# Patient Record
Sex: Male | Born: 1935 | Race: White | Hispanic: No | Marital: Married | Smoking: Former smoker
Health system: Southern US, Community
[De-identification: ages and names within clinical notes are randomized; demographics above are authoritative.]

## PROBLEM LIST (undated history)

## (undated) DIAGNOSIS — R339 Retention of urine, unspecified: Secondary | ICD-10-CM

## (undated) DIAGNOSIS — I10 Essential (primary) hypertension: Secondary | ICD-10-CM

## (undated) HISTORY — PX: MOUTH SURGERY: SHX715

---

## 2007-09-14 ENCOUNTER — Emergency Department (HOSPITAL_COMMUNITY): Admission: EM | Admit: 2007-09-14 | Discharge: 2007-09-14 | Payer: Self-pay | Admitting: Emergency Medicine

## 2008-08-17 ENCOUNTER — Emergency Department (HOSPITAL_COMMUNITY): Admission: EM | Admit: 2008-08-17 | Discharge: 2008-08-17 | Payer: Self-pay | Admitting: Emergency Medicine

## 2008-08-23 ENCOUNTER — Emergency Department (HOSPITAL_COMMUNITY): Admission: EM | Admit: 2008-08-23 | Discharge: 2008-08-24 | Payer: Self-pay | Admitting: Emergency Medicine

## 2008-12-22 ENCOUNTER — Emergency Department (HOSPITAL_COMMUNITY): Admission: EM | Admit: 2008-12-22 | Discharge: 2008-12-22 | Payer: Self-pay | Admitting: Emergency Medicine

## 2009-06-24 ENCOUNTER — Emergency Department (HOSPITAL_COMMUNITY): Admission: EM | Admit: 2009-06-24 | Discharge: 2009-06-24 | Payer: Self-pay | Admitting: Emergency Medicine

## 2010-04-10 ENCOUNTER — Emergency Department (HOSPITAL_COMMUNITY): Admission: EM | Admit: 2010-04-10 | Discharge: 2010-04-10 | Payer: Self-pay | Admitting: Emergency Medicine

## 2010-04-15 ENCOUNTER — Emergency Department (HOSPITAL_COMMUNITY): Admission: EM | Admit: 2010-04-15 | Discharge: 2010-04-15 | Payer: Self-pay | Admitting: Emergency Medicine

## 2010-06-18 ENCOUNTER — Emergency Department (HOSPITAL_COMMUNITY)
Admission: EM | Admit: 2010-06-18 | Discharge: 2010-06-18 | Payer: Self-pay | Source: Home / Self Care | Admitting: Emergency Medicine

## 2010-07-27 ENCOUNTER — Emergency Department (HOSPITAL_COMMUNITY)
Admission: EM | Admit: 2010-07-27 | Discharge: 2010-07-27 | Payer: Self-pay | Source: Home / Self Care | Admitting: Emergency Medicine

## 2010-07-27 LAB — POCT CARDIAC MARKERS
CKMB, poc: <1 ng/mL — ABNORMAL LOW (ref 1.0–8.0)
CKMB, poc: <1 ng/mL — ABNORMAL LOW (ref 1.0–8.0)
Myoglobin, poc: 63.3 ng/mL (ref 12–200)
Myoglobin, poc: 84.1 ng/mL (ref 12–200)
Troponin i, poc: <0.05 ng/mL (ref 0.00–0.09)
Troponin i, poc: <0.05 ng/mL (ref 0.00–0.09)
Troponin i, poc: <0.05 ng/mL (ref 0.00–0.09)

## 2010-07-27 LAB — BASIC METABOLIC PANEL
BUN: 12 mg/dL (ref 6–23)
CO2: 30 mEq/L (ref 19–32)
Chloride: 99 mEq/L (ref 96–112)
Creatinine, Ser: 0.84 mg/dL (ref 0.4–1.5)
GFR calc Af Amer: >60 mL/min (ref >60)
GFR calc non Af Amer: >60 mL/min (ref >60)
Glucose, Bld: 107 mg/dL — ABNORMAL HIGH (ref 70–99)
Potassium: 3.9 mEq/L (ref 3.5–5.1)
Sodium: 139 mEq/L (ref 135–145)

## 2010-07-27 LAB — CBC
Hemoglobin: 14.2 g/dL (ref 13.0–17.0)
MCH: 31.8 pg (ref 26.0–34.0)
MCHC: 33.7 g/dL (ref 30.0–36.0)
MCV: 94.4 fL (ref 78.0–100.0)
Platelets: 179 10*3/uL (ref 150–400)
RBC: 4.46 MIL/uL (ref 4.22–5.81)
RDW: 11.8 % (ref 11.5–15.5)
WBC: 4.1 10*3/uL (ref 4.0–10.5)

## 2010-07-27 LAB — DIFFERENTIAL
Basophils Absolute: 0 10*3/uL (ref 0.0–0.1)
Eosinophils Absolute: 0.1 10*3/uL (ref 0.0–0.7)
Eosinophils Relative: 3 % (ref 0–5)
Lymphs Abs: 0.5 10*3/uL — ABNORMAL LOW (ref 0.7–4.0)
Monocytes Relative: 14 % — ABNORMAL HIGH (ref 3–12)
Neutro Abs: 2.9 10*3/uL (ref 1.7–7.7)
Neutrophils Relative %: 71 % (ref 43–77)

## 2010-09-12 LAB — COMPREHENSIVE METABOLIC PANEL
ALT: 13 U/L (ref 0–53)
AST: 20 U/L (ref 0–37)
Albumin: 4 g/dL (ref 3.5–5.2)
Alkaline Phosphatase: 92 U/L (ref 39–117)
BUN: 10 mg/dL (ref 6–23)
CO2: 28 mEq/L (ref 19–32)
Calcium: 9 mg/dL (ref 8.4–10.5)
Chloride: 104 mEq/L (ref 96–112)
Creatinine, Ser: 0.83 mg/dL (ref 0.4–1.5)
GFR calc Af Amer: >60 mL/min (ref >60)
GFR calc non Af Amer: >60 mL/min (ref >60)
Glucose, Bld: 116 mg/dL — ABNORMAL HIGH (ref 70–99)
Potassium: 4.5 mEq/L (ref 3.5–5.1)
Sodium: 141 mEq/L (ref 135–145)
Total Bilirubin: 1.1 mg/dL (ref 0.3–1.2)
Total Protein: 6.7 g/dL (ref 6.0–8.3)

## 2010-09-12 LAB — DIFFERENTIAL
Basophils Absolute: 0 10*3/uL (ref 0.0–0.1)
Basophils Relative: 0 % (ref 0–1)
Eosinophils Absolute: 0.1 10*3/uL (ref 0.0–0.7)
Eosinophils Relative: 1 % (ref 0–5)
Lymphocytes Relative: 11 % — ABNORMAL LOW (ref 12–46)
Lymphs Abs: 0.5 10*3/uL — ABNORMAL LOW (ref 0.7–4.0)
Monocytes Absolute: 0.4 10*3/uL (ref 0.1–1.0)
Monocytes Relative: 8 % (ref 3–12)
Neutro Abs: 3.5 10*3/uL (ref 1.7–7.7)
Neutrophils Relative %: 80 % — ABNORMAL HIGH (ref 43–77)

## 2010-09-12 LAB — CBC
HCT: 43.2 % (ref 39.0–52.0)
Hemoglobin: 15.1 g/dL (ref 13.0–17.0)
MCH: 33.6 pg (ref 26.0–34.0)
MCHC: 35 g/dL (ref 30.0–36.0)
MCV: 96 fL (ref 78.0–100.0)
Platelets: 191 10*3/uL (ref 150–400)
RBC: 4.5 MIL/uL (ref 4.22–5.81)
RDW: 12 % (ref 11.5–15.5)
WBC: 4.4 10*3/uL (ref 4.0–10.5)

## 2010-09-12 LAB — URINALYSIS, ROUTINE W REFLEX MICROSCOPIC
Bilirubin Urine: NEGATIVE
Glucose, UA: NEGATIVE mg/dL
Hgb urine dipstick: NEGATIVE
Ketones, ur: NEGATIVE mg/dL
Nitrite: NEGATIVE
Protein, ur: NEGATIVE mg/dL
Specific Gravity, Urine: 1.011 (ref 1.005–1.030)
Urobilinogen, UA: 0.2 mg/dL (ref 0.0–1.0)
pH: 7.5 (ref 5.0–8.0)

## 2010-09-15 LAB — DIFFERENTIAL
Basophils Absolute: 0 10*3/uL (ref 0.0–0.1)
Basophils Relative: 0 % (ref 0–1)
Eosinophils Absolute: 0 10*3/uL (ref 0.0–0.7)
Eosinophils Relative: 0 % (ref 0–5)
Lymphocytes Relative: 5 % — ABNORMAL LOW (ref 12–46)
Lymphs Abs: 0.4 10*3/uL — ABNORMAL LOW (ref 0.7–4.0)
Monocytes Absolute: 0.3 10*3/uL (ref 0.1–1.0)
Monocytes Relative: 4 % (ref 3–12)
Neutro Abs: 6.5 10*3/uL (ref 1.7–7.7)
Neutrophils Relative %: 91 % — ABNORMAL HIGH (ref 43–77)

## 2010-09-15 LAB — COMPREHENSIVE METABOLIC PANEL
ALT: 15 U/L (ref 0–53)
AST: 19 U/L (ref 0–37)
Albumin: 4.3 g/dL (ref 3.5–5.2)
Alkaline Phosphatase: 75 U/L (ref 39–117)
BUN: 12 mg/dL (ref 6–23)
CO2: 27 mEq/L (ref 19–32)
Calcium: 9.4 mg/dL (ref 8.4–10.5)
Chloride: 104 mEq/L (ref 96–112)
Creatinine, Ser: 0.97 mg/dL (ref 0.4–1.5)
GFR calc Af Amer: >60 mL/min (ref >60)
GFR calc non Af Amer: >60 mL/min (ref >60)
Glucose, Bld: 125 mg/dL — ABNORMAL HIGH (ref 70–99)
Potassium: 4.1 mEq/L (ref 3.5–5.1)
Sodium: 139 mEq/L (ref 135–145)
Total Bilirubin: 1.6 mg/dL — ABNORMAL HIGH (ref 0.3–1.2)
Total Protein: 7 g/dL (ref 6.0–8.3)

## 2010-09-15 LAB — CBC
HCT: 45.1 % (ref 39.0–52.0)
Hemoglobin: 15.6 g/dL (ref 13.0–17.0)
MCH: 33.1 pg (ref 26.0–34.0)
MCHC: 34.7 g/dL (ref 30.0–36.0)
MCV: 95.4 fL (ref 78.0–100.0)
Platelets: 220 10*3/uL (ref 150–400)
RBC: 4.73 MIL/uL (ref 4.22–5.81)
RDW: 12.8 % (ref 11.5–15.5)
WBC: 7.2 10*3/uL (ref 4.0–10.5)

## 2010-10-03 LAB — CBC
MCV: 95.7 fL (ref 78.0–100.0)
Platelets: 199 10*3/uL (ref 150–400)
RBC: 4.15 MIL/uL — ABNORMAL LOW (ref 4.22–5.81)
WBC: 10.9 10*3/uL — ABNORMAL HIGH (ref 4.0–10.5)

## 2010-10-03 LAB — COMPREHENSIVE METABOLIC PANEL
ALT: 23 U/L (ref 0–53)
AST: 22 U/L (ref 0–37)
Albumin: 3.9 g/dL (ref 3.5–5.2)
CO2: 24 mEq/L (ref 19–32)
Chloride: 104 mEq/L (ref 96–112)
Creatinine, Ser: 1.31 mg/dL (ref 0.4–1.5)
GFR calc Af Amer: >60 mL/min (ref >60)
GFR calc non Af Amer: 53 mL/min — ABNORMAL LOW (ref >60)
Potassium: 4.2 mEq/L (ref 3.5–5.1)
Sodium: 136 mEq/L (ref 135–145)
Total Bilirubin: 1.3 mg/dL — ABNORMAL HIGH (ref 0.3–1.2)

## 2010-10-03 LAB — RAPID URINE DRUG SCREEN, HOSP PERFORMED
Amphetamines: NOT DETECTED
Barbiturates: NOT DETECTED
Benzodiazepines: NOT DETECTED
Cocaine: NOT DETECTED
Opiates: NOT DETECTED
Tetrahydrocannabinol: NOT DETECTED

## 2010-10-03 LAB — URINALYSIS, ROUTINE W REFLEX MICROSCOPIC
Bilirubin Urine: NEGATIVE
Glucose, UA: NEGATIVE mg/dL
Hgb urine dipstick: NEGATIVE
Nitrite: NEGATIVE
Protein, ur: NEGATIVE mg/dL
Specific Gravity, Urine: 1.018 (ref 1.005–1.030)
Urobilinogen, UA: 1 mg/dL (ref 0.0–1.0)
pH: 6 (ref 5.0–8.0)

## 2010-10-03 LAB — DIFFERENTIAL
Basophils Absolute: 0 10*3/uL (ref 0.0–0.1)
Eosinophils Absolute: 0 10*3/uL (ref 0.0–0.7)
Eosinophils Relative: 0 % (ref 0–5)
Lymphocytes Relative: 3 % — ABNORMAL LOW (ref 12–46)
Monocytes Absolute: 0.4 10*3/uL (ref 0.1–1.0)

## 2010-10-03 LAB — HEPATIC FUNCTION PANEL
ALT: 20 U/L (ref 0–53)
Alkaline Phosphatase: 73 U/L (ref 39–117)
Bilirubin, Direct: 0.1 mg/dL (ref 0.0–0.3)
Indirect Bilirubin: 1.4 mg/dL — ABNORMAL HIGH (ref 0.3–0.9)

## 2010-10-03 LAB — ETHANOL: Alcohol, Ethyl (B): <5 mg/dL (ref 0–10)

## 2010-10-10 LAB — URINALYSIS, ROUTINE W REFLEX MICROSCOPIC
Nitrite: NEGATIVE
Specific Gravity, Urine: 1.025 (ref 1.005–1.030)
Urobilinogen, UA: 1 mg/dL (ref 0.0–1.0)

## 2010-10-10 LAB — BASIC METABOLIC PANEL
CO2: 28 mEq/L (ref 19–32)
Chloride: 102 mEq/L (ref 96–112)
GFR calc Af Amer: >60 mL/min (ref >60)
Glucose, Bld: 127 mg/dL — ABNORMAL HIGH (ref 70–99)
Potassium: 4.1 mEq/L (ref 3.5–5.1)
Sodium: 139 mEq/L (ref 135–145)

## 2010-10-18 LAB — POCT CARDIAC MARKERS
CKMB, poc: 1.1 ng/mL (ref 1.0–8.0)
CKMB, poc: <1 ng/mL — ABNORMAL LOW (ref 1.0–8.0)
Myoglobin, poc: 49.7 ng/mL (ref 12–200)
Troponin i, poc: <0.05 ng/mL (ref 0.00–0.09)
Troponin i, poc: <0.05 ng/mL (ref 0.00–0.09)
Troponin i, poc: <0.05 ng/mL (ref 0.00–0.09)

## 2010-10-18 LAB — DIFFERENTIAL
Eosinophils Absolute: 0 10*3/uL (ref 0.0–0.7)
Eosinophils Relative: 0 % (ref 0–5)
Lymphs Abs: 0.2 10*3/uL — ABNORMAL LOW (ref 0.7–4.0)

## 2010-10-18 LAB — COMPREHENSIVE METABOLIC PANEL
ALT: 16 U/L (ref 0–53)
AST: 22 U/L (ref 0–37)
Alkaline Phosphatase: 90 U/L (ref 39–117)
CO2: 27 mEq/L (ref 19–32)
Calcium: 8.9 mg/dL (ref 8.4–10.5)
Chloride: 102 mEq/L (ref 96–112)
GFR calc Af Amer: >60 mL/min (ref >60)
GFR calc non Af Amer: >60 mL/min (ref >60)
Potassium: 4.1 mEq/L (ref 3.5–5.1)
Sodium: 141 mEq/L (ref 135–145)

## 2010-10-18 LAB — CBC
MCHC: 34.5 g/dL (ref 30.0–36.0)
RBC: 5.01 MIL/uL (ref 4.22–5.81)
WBC: 14.2 10*3/uL — ABNORMAL HIGH (ref 4.0–10.5)

## 2010-10-18 LAB — LIPASE, BLOOD: Lipase: 35 U/L (ref 11–59)

## 2010-11-10 ENCOUNTER — Emergency Department (HOSPITAL_COMMUNITY): Payer: Medicare Other

## 2010-11-10 ENCOUNTER — Emergency Department (HOSPITAL_COMMUNITY)
Admission: EM | Admit: 2010-11-10 | Discharge: 2010-11-10 | Disposition: A | Discharge status: Home / Self Care | Payer: Medicare Other | Attending: Emergency Medicine | Admitting: Emergency Medicine

## 2010-11-10 DIAGNOSIS — R109 Unspecified abdominal pain: Secondary | ICD-10-CM | POA: Insufficient documentation

## 2010-11-10 DIAGNOSIS — I10 Essential (primary) hypertension: Secondary | ICD-10-CM | POA: Insufficient documentation

## 2010-11-10 DIAGNOSIS — F329 Major depressive disorder, single episode, unspecified: Secondary | ICD-10-CM | POA: Insufficient documentation

## 2010-11-10 DIAGNOSIS — F3289 Other specified depressive episodes: Secondary | ICD-10-CM | POA: Insufficient documentation

## 2010-11-10 DIAGNOSIS — K5900 Constipation, unspecified: Secondary | ICD-10-CM | POA: Insufficient documentation

## 2010-11-10 DIAGNOSIS — E7800 Pure hypercholesterolemia, unspecified: Secondary | ICD-10-CM | POA: Insufficient documentation

## 2010-12-16 ENCOUNTER — Emergency Department (HOSPITAL_COMMUNITY)
Admission: EM | Admit: 2010-12-16 | Discharge: 2010-12-16 | Disposition: A | Discharge status: Home / Self Care | Payer: Medicare Other | Attending: Emergency Medicine | Admitting: Emergency Medicine

## 2010-12-16 ENCOUNTER — Emergency Department (HOSPITAL_COMMUNITY): Payer: Medicare Other

## 2010-12-16 DIAGNOSIS — K5900 Constipation, unspecified: Secondary | ICD-10-CM | POA: Insufficient documentation

## 2010-12-16 DIAGNOSIS — I10 Essential (primary) hypertension: Secondary | ICD-10-CM | POA: Insufficient documentation

## 2011-01-27 ENCOUNTER — Emergency Department (HOSPITAL_COMMUNITY)
Admission: EM | Admit: 2011-01-27 | Discharge: 2011-01-27 | Disposition: A | Discharge status: Home / Self Care | Payer: Medicare Other | Attending: Emergency Medicine | Admitting: Emergency Medicine

## 2011-01-27 DIAGNOSIS — K5900 Constipation, unspecified: Secondary | ICD-10-CM | POA: Insufficient documentation

## 2011-01-27 DIAGNOSIS — I10 Essential (primary) hypertension: Secondary | ICD-10-CM | POA: Insufficient documentation

## 2011-01-27 DIAGNOSIS — F329 Major depressive disorder, single episode, unspecified: Secondary | ICD-10-CM | POA: Insufficient documentation

## 2011-01-27 DIAGNOSIS — F3289 Other specified depressive episodes: Secondary | ICD-10-CM | POA: Insufficient documentation

## 2011-01-27 DIAGNOSIS — Z79899 Other long term (current) drug therapy: Secondary | ICD-10-CM | POA: Insufficient documentation

## 2011-03-20 ENCOUNTER — Inpatient Hospital Stay (HOSPITAL_COMMUNITY)
Admission: EM | Admit: 2011-03-20 | Discharge: 2011-03-22 | DRG: 918 | Disposition: A | Discharge status: Home Health | Payer: Medicare Other | Attending: Family Medicine | Admitting: Family Medicine

## 2011-03-20 ENCOUNTER — Emergency Department (HOSPITAL_COMMUNITY): Payer: Medicare Other

## 2011-03-20 DIAGNOSIS — F329 Major depressive disorder, single episode, unspecified: Secondary | ICD-10-CM | POA: Diagnosis present

## 2011-03-20 DIAGNOSIS — Z66 Do not resuscitate: Secondary | ICD-10-CM | POA: Diagnosis present

## 2011-03-20 DIAGNOSIS — R4182 Altered mental status, unspecified: Secondary | ICD-10-CM | POA: Diagnosis present

## 2011-03-20 DIAGNOSIS — F3289 Other specified depressive episodes: Secondary | ICD-10-CM | POA: Diagnosis present

## 2011-03-20 DIAGNOSIS — T424X1A Poisoning by benzodiazepines, accidental (unintentional), initial encounter: Secondary | ICD-10-CM | POA: Diagnosis present

## 2011-03-20 DIAGNOSIS — T424X4A Poisoning by benzodiazepines, undetermined, initial encounter: Principal | ICD-10-CM | POA: Diagnosis present

## 2011-03-20 DIAGNOSIS — E860 Dehydration: Secondary | ICD-10-CM | POA: Diagnosis present

## 2011-03-20 DIAGNOSIS — Y92009 Unspecified place in unspecified non-institutional (private) residence as the place of occurrence of the external cause: Secondary | ICD-10-CM

## 2011-03-20 DIAGNOSIS — F411 Generalized anxiety disorder: Secondary | ICD-10-CM | POA: Diagnosis present

## 2011-03-20 DIAGNOSIS — I10 Essential (primary) hypertension: Secondary | ICD-10-CM | POA: Diagnosis present

## 2011-03-20 LAB — URINALYSIS, ROUTINE W REFLEX MICROSCOPIC
Glucose, UA: NEGATIVE mg/dL
Ketones, ur: NEGATIVE mg/dL
Leukocytes, UA: NEGATIVE
Nitrite: NEGATIVE
Protein, ur: NEGATIVE mg/dL

## 2011-03-20 LAB — CBC
Platelets: 186 10*3/uL (ref 150–400)
RDW: 11.5 % (ref 11.5–15.5)
WBC: 4.9 10*3/uL (ref 4.0–10.5)

## 2011-03-20 LAB — BLOOD GAS, ARTERIAL
Acid-Base Excess: 4.3 mmol/L — ABNORMAL HIGH (ref 0.0–2.0)
Bicarbonate: 28.8 mEq/L — ABNORMAL HIGH (ref 20.0–24.0)
FIO2: 0.21 %
O2 Saturation: 96.2 %
Patient temperature: 37
TCO2: 25.6 mmol/L (ref 0–100)
pO2, Arterial: 77.4 mmHg — ABNORMAL LOW (ref 80.0–100.0)

## 2011-03-20 LAB — SALICYLATE LEVEL: Salicylate Lvl: <2 mg/dL — ABNORMAL LOW (ref 2.8–20.0)

## 2011-03-20 LAB — ETHANOL: Alcohol, Ethyl (B): <11 mg/dL (ref 0–11)

## 2011-03-20 LAB — DIFFERENTIAL
Basophils Absolute: 0 10*3/uL (ref 0.0–0.1)
Eosinophils Absolute: 0.1 10*3/uL (ref 0.0–0.7)
Eosinophils Relative: 1 % (ref 0–5)
Lymphocytes Relative: 12 % (ref 12–46)

## 2011-03-20 LAB — COMPREHENSIVE METABOLIC PANEL
Albumin: 3 g/dL — ABNORMAL LOW (ref 3.5–5.2)
BUN: 14 mg/dL (ref 6–23)
Creatinine, Ser: 0.78 mg/dL (ref 0.50–1.35)
Total Bilirubin: 0.3 mg/dL (ref 0.3–1.2)
Total Protein: 5.5 g/dL — ABNORMAL LOW (ref 6.0–8.3)

## 2011-03-20 LAB — RAPID URINE DRUG SCREEN, HOSP PERFORMED
Amphetamines: NOT DETECTED
Barbiturates: NOT DETECTED

## 2011-03-20 LAB — APTT: aPTT: 30 seconds (ref 24–37)

## 2011-03-20 LAB — GLUCOSE, CAPILLARY: Glucose-Capillary: 130 mg/dL — ABNORMAL HIGH (ref 70–99)

## 2011-03-20 LAB — PROTIME-INR: INR: 1.04 (ref 0.00–1.49)

## 2011-03-21 ENCOUNTER — Emergency Department (HOSPITAL_COMMUNITY): Payer: Medicare Other

## 2011-03-21 LAB — BASIC METABOLIC PANEL
BUN: 11 mg/dL (ref 6–23)
CO2: 27 mEq/L (ref 19–32)
Chloride: 103 mEq/L (ref 96–112)
Creatinine, Ser: 0.67 mg/dL (ref 0.50–1.35)
GFR calc Af Amer: >60 mL/min (ref >60)
Glucose, Bld: 103 mg/dL — ABNORMAL HIGH (ref 70–99)
Potassium: 4.3 mEq/L (ref 3.5–5.1)

## 2011-03-21 LAB — URINE CULTURE
Colony Count: NO GROWTH
Culture  Setup Time: 201209180227

## 2011-03-22 ENCOUNTER — Emergency Department (HOSPITAL_COMMUNITY)
Admission: EM | Admit: 2011-03-22 | Discharge: 2011-03-23 | Disposition: A | Discharge status: Home / Self Care | Payer: Medicare Other | Attending: Emergency Medicine | Admitting: Emergency Medicine

## 2011-03-22 DIAGNOSIS — F29 Unspecified psychosis not due to a substance or known physiological condition: Secondary | ICD-10-CM | POA: Insufficient documentation

## 2011-03-22 DIAGNOSIS — R4182 Altered mental status, unspecified: Secondary | ICD-10-CM | POA: Insufficient documentation

## 2011-03-22 DIAGNOSIS — F3289 Other specified depressive episodes: Secondary | ICD-10-CM | POA: Insufficient documentation

## 2011-03-22 DIAGNOSIS — IMO0002 Reserved for concepts with insufficient information to code with codable children: Secondary | ICD-10-CM | POA: Insufficient documentation

## 2011-03-22 DIAGNOSIS — F329 Major depressive disorder, single episode, unspecified: Secondary | ICD-10-CM | POA: Insufficient documentation

## 2011-03-22 DIAGNOSIS — I10 Essential (primary) hypertension: Secondary | ICD-10-CM | POA: Insufficient documentation

## 2011-03-22 DIAGNOSIS — Z79899 Other long term (current) drug therapy: Secondary | ICD-10-CM | POA: Insufficient documentation

## 2011-03-22 LAB — CBC
HCT: 37.2 % — ABNORMAL LOW (ref 39.0–52.0)
MCHC: 34.1 g/dL (ref 30.0–36.0)
MCHC: 35.6 g/dL (ref 30.0–36.0)
Platelets: 188 10*3/uL (ref 150–400)
RDW: 11.6 % (ref 11.5–15.5)
RDW: 11.5 % (ref 11.5–15.5)
WBC: 6 10*3/uL (ref 4.0–10.5)
WBC: 5.1 10*3/uL (ref 4.0–10.5)

## 2011-03-22 LAB — URINALYSIS, ROUTINE W REFLEX MICROSCOPIC
Bilirubin Urine: NEGATIVE
Glucose, UA: NEGATIVE mg/dL
Ketones, ur: NEGATIVE mg/dL
pH: 6.5 (ref 5.0–8.0)

## 2011-03-22 LAB — URINE MICROSCOPIC-ADD ON

## 2011-03-22 LAB — DIFFERENTIAL
Basophils Absolute: 0 10*3/uL (ref 0.0–0.1)
Basophils Relative: 0 % (ref 0–1)
Eosinophils Relative: 1 % (ref 0–5)
Monocytes Absolute: 0.5 10*3/uL (ref 0.1–1.0)
Neutro Abs: 3.9 10*3/uL (ref 1.7–7.7)

## 2011-03-22 LAB — COMPREHENSIVE METABOLIC PANEL
ALT: 10 U/L (ref 0–53)
AST: 17 U/L (ref 0–37)
Albumin: 3.4 g/dL — ABNORMAL LOW (ref 3.5–5.2)
Alkaline Phosphatase: 88 U/L (ref 39–117)
Chloride: 100 mEq/L (ref 96–112)
Potassium: 3.4 mEq/L — ABNORMAL LOW (ref 3.5–5.1)
Sodium: 138 mEq/L (ref 135–145)
Total Protein: 6.2 g/dL (ref 6.0–8.3)

## 2011-03-22 LAB — BASIC METABOLIC PANEL
BUN: 11 mg/dL (ref 6–23)
Chloride: 103 mEq/L (ref 96–112)
Creatinine, Ser: 0.67 mg/dL (ref 0.50–1.35)
GFR calc Af Amer: >60 mL/min (ref >60)
GFR calc non Af Amer: >60 mL/min (ref >60)
Potassium: 3.7 mEq/L (ref 3.5–5.1)

## 2011-03-22 LAB — TSH: TSH: 0.547 u[IU]/mL (ref 0.350–4.500)

## 2011-03-24 NOTE — Discharge Summary (Signed)
NAME:  KENTARIUS, PARTINGTON NO.:  [id number]  MEDICAL RECORD NO.:  [id number]  LOCATION:  1406                         FACILITY:  [facility]  PHYSICIAN:  Brendia Sacks, MD    DATE OF BIRTH:  1933/06/07  DATE OF ADMISSION:  03/20/2011 DATE OF DISCHARGE:  03/22/2011                              DISCHARGE SUMMARY   PRIMARY CARE PHYSICIAN:  Dr. Tiburcio Pea at [facility].  CONDITION ON DISCHARGE:  Improved.  DISPOSITION:  Home with home health physical therapy.  HISTORY OF PRESENT ILLNESS:  This is a 77 year old man, who was found sitting on the floor at home with confusion and mumbling and being disoriented.  He was brought to the emergency room for further evaluation and treatment.  HOSPITAL COURSE:  Mr. Goncalves was admitted to the medical floor.  He was noted to be alert and oriented on admission per the admitting physician's history and physical.  I have discussed the patient's history with his wife present today, who reports the patient was in his usual state of health when he was given his Seroquel for the evening with [place] sandwich and some Colace for the constipation.  Somewhat later, he was found sitting on the floor, mumbling and disoriented. There was no seizure activity.  There is no focal weakness.  Of note, the patient does have a longstanding history of urinary incontinence. The patient is on Seroquel and Effexor for depression, which is longstanding, has been ongoing for approximately 5 years.  His normal day is a fairly sedentary one.  He spends most of his time watching news programs on television, does minimal walking, drinks minimal fluids and is hyper focused on bowel movements.  He was recently restarted on Seroquel approximately 1 month or more so ago and has been tolerating this medication well.  His wife administers all his medications with the exception of lorazepam.  She has allowed him to administer this medication because she  works and he sometimes needs this for anxiety. She reports that his anxiety issues stem from fear of constipation.  The patient has heard in the past that going too long without a bowel movement would be fatal.  The wife does report the patient was concerned about not having had a bowel movement yesterday and took some Colace.  Since admission to the hospital, he has been treated with IV fluids and supportive care, and he has resolved back to baseline.  In further discussion with his wife, it appears to most likely etiology of his constellation of symptoms was secondary to Ativan.  The patient cannot tell me how many Ativan he took that evening, but he may have taken a few.  It is interesting to note that his urine drug screen was negative.  However, this would not be inconsistent with recent ingestion of benzodiazepine.  The patient does confirm that he did take benzodiazepines that evening.  He was noted to have a normal neurologic exam on admission.  Of note, the patient had a normal examination as noted by the emergency room physician as well. His head imaging here is negative.  There are no signs or symptoms to suggest stroke or seizure at this point.  After a long discussion  with the wife, my clinical impression is misuse of his Ativan medication.  I have counseled his wife and suggest that she take control of this medication.  I did discuss further evaluation including echocardiogram and carotid ultrasound as well as other testing; however, she does not feel this is necessary at this point and given the patient's most likely etiology for his acute altered mental status, I think this is quite reasonable.  CONSULTATIONS:  None.  PROCEDURES:  None.  IMAGING: 1. CT of the head, March 20, 2011:  Atrophy and small-vessel     disease.  No acute intracranial findings. 2. MRI of the brain, March 21, 2011:  Atrophy and small-vessel     disease.  No acute intracranial  findings.  MICROBIOLOGY:  Urine culture on March 20, 2011, no growth, final.  PERTINENT LABORATORY STUDIES:  CBC unremarkable with hemoglobin of 12.72.  Basic metabolic panel, unremarkable.  Cardiac function panel, unremarkable.  Urine drug screen was negative.  Serum acetaminophen and salicylate levels were negative.  Alcohol level was negative and urinalysis was negative.  PHYSICAL EXAMINATION:  GENERAL:  On discharge, the patient is feeling well.  No acute issues. VITAL SIGNS:  He is afebrile.  Temperature is 98.6, pulse 80, respirations 20, blood pressure 150/87, saturations 96% on room air. CARDIOVASCULAR:  Regular rate and rhythm.  No murmur, rub, or gallop. RESPIRATORY:  Clear to auscultation bilaterally.  No wheezes, rales, or rhonchi.  Normal respiratory effort.  No lower extremity edema. NEUROLOGIC:  Cranial nerves II-XII are intact.  Speech is fluent and clear tone and strength in the upper and lower extremities is 5/5 and symmetric.  There is no dysstatic kinesis of the upper extremities.  No focal deficits are noted. PSYCHIATRIC:  Flat affect, depressed mood.  Additionally, the patient's fluid intake is extremely poor.  He does not drink water and is constantly dehydrated.  His wife feels that the patient looks the best he has looked in years.  She suspects dehydration is playing a role as well.  DISCHARGE MEDICATIONS: 1. MiraLax 17 g p.o. daily as needed for constipation. 2. Lorazepam 0.5 mg p.o. b.i.d. as needed for anxiety.  Suggest his     wife take control of his medication. 3. Finasteride 5 mg p.o. daily. 4. Losartan 100 mg p.o. daily. 5. Multivitamin p.o. daily. 6. Quetiapine 300 mg p.o. nightly. 7. Venlafaxine 75 mg p.o. daily. 8. Vitamin E p.o. daily.  There is no suggestion the patient was intending himself any harm.  Time coordinating discharge is 45 minutes.     Brendia Sacks, MD     DG/MEDQ  D:  03/22/2011  T:  03/22/2011  Job:   [id number]  cc:   [facility]  Electronically Signed by Brendia Sacks  on 03/24/2011 09:02:57 PM

## 2011-03-27 LAB — ETHANOL: Alcohol, Ethyl (B): <5

## 2011-03-27 LAB — RAPID URINE DRUG SCREEN, HOSP PERFORMED
Opiates: NOT DETECTED
Tetrahydrocannabinol: NOT DETECTED

## 2011-03-27 LAB — URINALYSIS, ROUTINE W REFLEX MICROSCOPIC
Bilirubin Urine: NEGATIVE
Nitrite: NEGATIVE
Specific Gravity, Urine: 1.014
Urobilinogen, UA: 1

## 2011-03-27 LAB — CBC
HCT: 48
Hemoglobin: 16.7
WBC: 9.2

## 2011-03-27 LAB — DIFFERENTIAL
Eosinophils Relative: 0
Lymphocytes Relative: 5 — ABNORMAL LOW
Lymphs Abs: 0.5 — ABNORMAL LOW
Monocytes Absolute: 0.5

## 2011-03-27 LAB — BASIC METABOLIC PANEL
GFR calc non Af Amer: >60
Glucose, Bld: 123 — ABNORMAL HIGH
Potassium: 4.6
Sodium: 136

## 2011-03-27 NOTE — H&P (Signed)
NAME:  Juan Diaz, Juan Diaz NO.:  [id number]  MEDICAL RECORD NO.:  [id number]  LOCATION:  WLED                         FACILITY:  [facility]  PHYSICIAN:  Candelaria Celeste, DO      DATE OF BIRTH:  06/07/1933  DATE OF ADMISSION:  03/20/2011 DATE OF DISCHARGE:                             HISTORY & PHYSICAL   HISTORY OF PRESENT ILLNESS:  Mr. Manlove is a 77 year old male who has an acute onset of altered mental status since this evening.  The patient's wife gave the history who states that approximately 30 minutes after dinner and evening medications of Seroquel and Colace, the patient was found sitting on the floor.  He was mumbling and disoriented and appeared to be very weak and unable to stand without assistance.  EMS was immediately called and the patient was brought to the emergency department.  The patient's medications are largely under the care of patient's wife, especially the patient's Seroquel which he takes every evening.  The only medication patient has an access to is his Ativan which is 0.5 mg.  The patient denies overdose and denies fevers, chills, nausea, vomiting, blurred vision, confusion, headache, vision changes. The patient states that he does recall coming to the hospital via ambulance.  PAST MEDICAL HISTORY: 1. Hypertension. 2. Depression. 3. Benign prostatic hypertrophy.  MEDICATIONS: 1. Quetiapine 300 mg q.h.s. 2. Ativan 0.5 mg t.i.d. 3. Losartan 100 mg daily. 4. Venlafaxine 75 mg daily. 5. Finasteride 5 mg daily. 6. Colace.  ALLERGIES:  No known drug allergies.  SURGICAL HISTORY:  No surgeries.  FAMILY HISTORY:  Family history is significant for coronary artery disease, hypertension, colon cancer.  SOCIAL HISTORY:  The patient works at [employer] for 34 years.  He is a smoker who quit 39 years ago.  Denies alcohol or illicit drug use.  REVIEW OF SYSTEMS:  A full 10-system review of systems was performed which is stated in the HPI or  otherwise negative.  PHYSICAL EXAMINATION:  VITAL SIGNS:  Temperature is 97.9, heart rate is 97-100, blood pressure is 93/63, respiratory rate 20, oxygen saturation 97% on room air. GENERAL:  This is an elderly Caucasian male who is in her stated age, who is awake, alert, and oriented x3.  He does mumble but his speech is coherent and fairly intelligible. HEENT: Head normocephalic, atraumatic.  Pupils equal, round, reactive to light.  Extraocular muscles are intact.  Sclerae anicteric and noninjected.  Tympanic membranes are clear.  Mucosal membranes are dry with no erythema present in the posterior oropharynx. NECK:  Supple without lymphadenopathy. LUNGS:  Clear to auscultation bilaterally with no wheezes, rales, or rhonchi. CARDIAC:  Regular rate and rhythm with normal S1 and S2 sounds with no murmurs auscultated. ABDOMEN:  Soft, nontender, nondistended with appropriate bowel sounds. No hepatosplenomegaly or masses palpated. VASCULAR:  2+ dorsalis pedis radial pulses with warm skin and no edema present. MUSCULOSKELETAL:  All major joints are nonerythematous and nonenlarged and nonedematous. NEURO:  Cranial nerves II-XII are grossly intact.  Strength is 5/5 in upper and lower extremities.  No focal deficits observed.  Rapid alternating movements are intact.  There is no ataxia or tremor present. PSYCH:  The patient is fairly expressive with  intelligible speech although mumbled at times.  The content of the speech is normal and pertains to conversation at hand.  LABORATORY DATA: 1. A CMP was drawn which was normal. 2. LFTs:  Normal. 3. CBC showed a white count of 4.9 with all other values normal. 4. Anticoagulants:  INR was 1.04 and PTT was 30. 5. Drug screens:  Tylenol  was negative.  Alcohol was negative.     Salicylates was negative.  A urine drug screen was negative. 6. Urinalysis was negative for leukocytes or nitrites.  IMAGING:  A CT of the brain showed no focal  process.  ASSESSMENT/PLAN: 1. Altered mental status, question etiology - resolving.  The most     common forms of overdose has been ruled out particularly the     patient's lorazepam and the patient's urine drug screen showed     normal levels of benzodiazepine, therefore this appears to not be a     factor of overdose either.  Furthermore, there is no evidence of     infection or electrolyte abnormalities causing altered mental     status.  Remaining in the differential diagnosis would be ischemic     stroke or thyroid abnormalities or psychosis.  The latter being     unlikely due to the rapid resolution.  We will obtain a TSH as well     as an MRI of the head to rule out thyroid abnormalities and     ischemic stroke.  We will admit the patient to telemetry and     continue to monitor the patient. 2. Dehydration, evidenced by clinical exam.  We will continue the     patient's IV fluids and encourage p.o. hydration. 3. Depression.  We will continue the patient's home meds. 4. Hypertension.  Continue the patient's home meds. 5. Anxiety.  Continue the patient's home meds. 6. Code status.  The patient wishes a do not resuscitate which I     believe is accurate in the patient's wishes as it appears patient's     mental status has returned to baseline.  The patient does     demonstrate capacity. 7. DVT prophylaxis.  We will start the patient on Arixtra.          ______________________________ Candelaria Celeste, DO     JS/MEDQ  D:  03/21/2011  T:  03/21/2011  Job:  [id number]  Electronically Signed by Candelaria Celeste DO on 03/27/2011 01:36:02 PM

## 2011-05-21 ENCOUNTER — Emergency Department (HOSPITAL_COMMUNITY)
Admission: EM | Admit: 2011-05-21 | Discharge: 2011-05-21 | Disposition: A | Discharge status: Home / Self Care | Payer: Medicare Other | Attending: Emergency Medicine | Admitting: Emergency Medicine

## 2011-05-21 DIAGNOSIS — R319 Hematuria, unspecified: Secondary | ICD-10-CM | POA: Insufficient documentation

## 2011-05-21 DIAGNOSIS — N390 Urinary tract infection, site not specified: Secondary | ICD-10-CM | POA: Insufficient documentation

## 2011-05-21 DIAGNOSIS — I10 Essential (primary) hypertension: Secondary | ICD-10-CM | POA: Insufficient documentation

## 2011-05-21 DIAGNOSIS — R109 Unspecified abdominal pain: Secondary | ICD-10-CM | POA: Insufficient documentation

## 2011-05-21 DIAGNOSIS — R339 Retention of urine, unspecified: Secondary | ICD-10-CM | POA: Insufficient documentation

## 2011-05-21 HISTORY — DX: Essential (primary) hypertension: I10

## 2011-05-21 HISTORY — DX: Retention of urine, unspecified: R33.9

## 2011-05-21 LAB — URINALYSIS, ROUTINE W REFLEX MICROSCOPIC
Bilirubin Urine: NEGATIVE
Glucose, UA: NEGATIVE mg/dL
Hgb urine dipstick: NEGATIVE
Specific Gravity, Urine: 1.013 (ref 1.005–1.030)
Urobilinogen, UA: 0.2 mg/dL (ref 0.0–1.0)
pH: 7.5 (ref 5.0–8.0)

## 2011-05-21 LAB — URINE MICROSCOPIC-ADD ON

## 2011-05-21 MED ORDER — CIPROFLOXACIN HCL 500 MG PO TABS: 500.0000 mg | ORAL_TABLET | Freq: Two times a day (BID) | ORAL | Status: AC

## 2011-05-21 MED ORDER — CIPROFLOXACIN HCL 500 MG PO TABS
500.0000 mg | ORAL_TABLET | Freq: Once | ORAL | Status: AC
  Administered 2011-05-21: 500 mg via ORAL
  Filled 2011-05-21: qty 1

## 2011-05-21 NOTE — ED Provider Notes (Cosign Needed)
History     CSN: [id number] Arrival date & time: 05/21/2011  4:19 PM   First MD Initiated Contact with Patient 05/21/11 1652      Chief Complaint  Patient presents with  . Urinary Retention    pt in from home with urinary problems states noted blood in urine last night states "small stream" around noon denies pain with urination pt is in no current distress states was told by urgent care to come in    (Consider location/radiation/quality/duration/timing/severity/associated sxs/prior treatment) Patient is a 77 y.o. male presenting with hematuria. The history is provided by the patient (The patient complains of blood in his urine this started today).  Hematuria This is a new problem. The current episode started today. The problem is unchanged. He describes the hematuria as gross hematuria. The hematuria occurs during the initial portion of his urinary stream. He reports no clotting in his urine stream. His pain is at a severity of 1/10. The pain is mild. He describes his urine color as clear. Irritative symptoms do not include frequency or urgency. Obstructive symptoms include straining. Obstructive symptoms do not include dribbling. Associated symptoms include abdominal pain. His past medical history is significant for BPH.    Past Medical History  Diagnosis Date  . Hypertension   . Urinary retention     History reviewed. No pertinent past surgical history.  History reviewed. No pertinent family history.  History  Substance Use Topics  . Smoking status: Never Smoker   . Smokeless tobacco: Not on file  . Alcohol Use: No      Review of Systems  Constitutional: Negative for fatigue.  HENT: Negative for congestion, sinus pressure and ear discharge.   Eyes: Negative for discharge.  Respiratory: Negative for cough.   Cardiovascular: Negative for chest pain.  Gastrointestinal: Positive for abdominal pain. Negative for diarrhea.  Genitourinary: Positive for hematuria. Negative  for urgency and frequency.  Musculoskeletal: Negative for back pain.  Skin: Negative for rash.  Neurological: Negative for seizures and headaches.  Hematological: Negative.   Psychiatric/Behavioral: Negative for hallucinations.    Allergies  Review of patient's allergies indicates no known allergies.  Home Medications   Current Outpatient Rx  Name Route Sig Dispense Refill  . ASPIRIN 81 MG PO TABS Oral Take 81 mg by mouth daily.      Marland Kitchen FINASTERIDE 5 MG PO TABS Oral Take 5 mg by mouth daily.      Marland Kitchen LOSARTAN POTASSIUM 100 MG PO TABS Oral Take 100 mg by mouth daily.      [name] PO TABS Oral Take 1 tablet by mouth daily.      . VENLAFAXINE HCL 75 MG PO CP24 Oral Take 225 mg by mouth daily.      Marland Kitchen VITAMIN E 400 UNITS PO CAPS Oral Take 400 Units by mouth daily.      Marland Kitchen CIPROFLOXACIN HCL 500 MG PO TABS Oral Take 1 tablet (500 mg total) by mouth 2 (two) times daily. 14 tablet 0    BP 142/82  Pulse 96  Temp(Src) 98.5 F (36.9 C) (Oral)  Resp 19  SpO2 98%  Physical Exam  Constitutional: He is oriented to person, place, and time. He appears well-developed.  HENT:  Head: Normocephalic and atraumatic.  Eyes: Conjunctivae and EOM are normal. No scleral icterus.  Neck: Neck supple. No thyromegaly present.  Cardiovascular: Normal rate and regular rhythm.  Exam reveals no gallop and no friction rub.   No murmur heard. Pulmonary/Chest:  No stridor. He has no wheezes. He has no rales. He exhibits no tenderness.  Abdominal: He exhibits no distension. There is no tenderness. There is no rebound.  Musculoskeletal: Normal range of motion. He exhibits no edema.  Lymphadenopathy:    He has no cervical adenopathy.  Neurological: He is oriented to person, place, and time. Coordination normal.  Skin: No rash noted. No erythema.  Psychiatric: He has a normal mood and affect. His behavior is normal.    ED Course  Procedures (including critical care time)  Labs Reviewed  URINALYSIS,  ROUTINE W REFLEX MICROSCOPIC - Abnormal; Notable for the following:    Appearance CLOUDY (*)    Nitrite POSITIVE (*)    Leukocytes, UA MODERATE (*)    All other components within normal limits  URINE MICROSCOPIC-ADD ON - Abnormal; Notable for the following:    Bacteria, UA MANY (*)    All other components within normal limits  URINE CULTURE   No results found.   1. UTI (lower urinary tract infection)     Results for orders placed during the hospital encounter of 05/21/11  URINALYSIS, ROUTINE W REFLEX MICROSCOPIC      Component Value Range   Color, Urine YELLOW  YELLOW    Appearance CLOUDY (*) CLEAR    Specific Gravity, Urine 1.013  1.005 - 1.030    pH 7.5  5.0 - 8.0    Glucose, UA NEGATIVE  NEGATIVE (mg/dL)   Hgb urine dipstick NEGATIVE  NEGATIVE    Bilirubin Urine NEGATIVE  NEGATIVE    Ketones, ur NEGATIVE  NEGATIVE (mg/dL)   Protein, ur NEGATIVE  NEGATIVE (mg/dL)   Urobilinogen, UA 0.2  0.0 - 1.0 (mg/dL)   Nitrite POSITIVE (*) NEGATIVE    Leukocytes, UA MODERATE (*) NEGATIVE   URINE MICROSCOPIC-ADD ON      Component Value Range   WBC, UA TOO NUMEROUS TO COUNT  <3 (WBC/hpf)   Bacteria, UA MANY (*) RARE    No results found.    MDM          Benny Lennert, MD 05/21/11 [phone]

## 2011-05-23 LAB — URINE CULTURE: Colony Count: >=100000

## 2011-05-24 NOTE — ED Notes (Signed)
+   Urine Patient treated with Cipro-sensitive to same-chart appended per protocol MD. 

## 2011-09-07 ENCOUNTER — Inpatient Hospital Stay (HOSPITAL_COMMUNITY)
Admission: EM | Admit: 2011-09-07 | Discharge: 2011-09-15 | DRG: 690 | Disposition: A | Discharge status: Skilled Nursing Facility | Payer: Medicare Other | Attending: Internal Medicine | Admitting: Internal Medicine

## 2011-09-07 ENCOUNTER — Encounter (HOSPITAL_COMMUNITY): Payer: Self-pay | Admitting: Emergency Medicine

## 2011-09-07 ENCOUNTER — Emergency Department (HOSPITAL_COMMUNITY): Payer: Medicare Other

## 2011-09-07 DIAGNOSIS — W19XXXA Unspecified fall, initial encounter: Secondary | ICD-10-CM | POA: Diagnosis present

## 2011-09-07 DIAGNOSIS — R4182 Altered mental status, unspecified: Secondary | ICD-10-CM | POA: Diagnosis present

## 2011-09-07 DIAGNOSIS — E860 Dehydration: Secondary | ICD-10-CM | POA: Diagnosis present

## 2011-09-07 DIAGNOSIS — R5381 Other malaise: Secondary | ICD-10-CM | POA: Diagnosis present

## 2011-09-07 DIAGNOSIS — W010XXA Fall on same level from slipping, tripping and stumbling without subsequent striking against object, initial encounter: Secondary | ICD-10-CM | POA: Diagnosis present

## 2011-09-07 DIAGNOSIS — R451 Restlessness and agitation: Secondary | ICD-10-CM

## 2011-09-07 DIAGNOSIS — N390 Urinary tract infection, site not specified: Principal | ICD-10-CM | POA: Diagnosis present

## 2011-09-07 DIAGNOSIS — Y92009 Unspecified place in unspecified non-institutional (private) residence as the place of occurrence of the external cause: Secondary | ICD-10-CM

## 2011-09-07 DIAGNOSIS — F333 Major depressive disorder, recurrent, severe with psychotic symptoms: Secondary | ICD-10-CM | POA: Diagnosis present

## 2011-09-07 DIAGNOSIS — D72829 Elevated white blood cell count, unspecified: Secondary | ICD-10-CM | POA: Diagnosis present

## 2011-09-07 DIAGNOSIS — F05 Delirium due to known physiological condition: Secondary | ICD-10-CM | POA: Diagnosis present

## 2011-09-07 LAB — POCT I-STAT, CHEM 8
BUN: 17 mg/dL (ref 6–23)
Creatinine, Ser: 1.2 mg/dL (ref 0.50–1.35)
Potassium: 4.1 mEq/L (ref 3.5–5.1)
Sodium: 140 mEq/L (ref 135–145)

## 2011-09-07 LAB — CBC
HCT: 38.3 % — ABNORMAL LOW (ref 39.0–52.0)
Hemoglobin: 13.5 g/dL (ref 13.0–17.0)
MCHC: 35.2 g/dL (ref 30.0–36.0)
MCV: 93 fL (ref 78.0–100.0)

## 2011-09-07 LAB — URINE MICROSCOPIC-ADD ON

## 2011-09-07 LAB — URINALYSIS, ROUTINE W REFLEX MICROSCOPIC
Bilirubin Urine: NEGATIVE
Glucose, UA: NEGATIVE mg/dL
Hgb urine dipstick: NEGATIVE
Protein, ur: NEGATIVE mg/dL
Specific Gravity, Urine: 1.024 (ref 1.005–1.030)

## 2011-09-07 MED ORDER — LIDOCAINE HCL 1 % IJ SOLN
INTRAMUSCULAR | Status: AC
  Filled 2011-09-07: qty 20

## 2011-09-07 MED ORDER — CEPHALEXIN 250 MG PO CAPS
250.0000 mg | ORAL_CAPSULE | Freq: Once | ORAL | Status: AC
  Administered 2011-09-07: 250 mg via ORAL
  Filled 2011-09-07: qty 1

## 2011-09-07 MED ORDER — CEFTRIAXONE SODIUM 1 G IJ SOLR
1.0000 g | Freq: Once | INTRAMUSCULAR | Status: AC
  Administered 2011-09-07: 1 g via INTRAMUSCULAR
  Filled 2011-09-07: qty 10

## 2011-09-07 NOTE — ED Notes (Signed)
FNP, Schulz at bedside. 

## 2011-09-07 NOTE — ED Notes (Signed)
XLK:GM01<UU> Expected date:09/07/11<BR> Expected time: 8:30 PM<BR> Means of arrival:Ambulance<BR> Comments:<BR> ems 30. 78 yo m. Fall, weak confused. Ongoing. 10

## 2011-09-07 NOTE — ED Notes (Signed)
Pt lives at home with wife. Has been falling recently over last 2 weeks. Fell today. Pt has skin tears on L elbow and R upper arm. Older eccymosis to R eye. Pt a/o x 4 per EMS.  Pt is confused per wife. Pt told wife that he went to the hospital today and was dipped in a vat of blood.

## 2011-09-07 NOTE — ED Notes (Signed)
Pt's wife states that ever since he fell a week and a half ago, his gait is shuffling and he is unsteady on his feet. She also states that he has become more confused and making up stories and events that never happened. FNP, Manus Rudd aware.

## 2011-09-07 NOTE — ED Provider Notes (Signed)
History     CSN: [id number]  Arrival date & time 09/07/11  2026   First MD Initiated Contact with Patient 09/07/11 2101      Chief Complaint  Patient presents with  . Fall    (Consider location/radiation/quality/duration/timing/severity/associated sxs/prior treatment) HPI Comments: Per patient's wife.  He has been falling more frequently than normal since every 24 days had 4 mechanical fall, where his been able to get up on his own.  Today she noticed that he was quite confused, she has noticed no new bruising, lacerations, abrasions, hematomas  Patient is a 78 y.o. male presenting with fall. The history is provided by the spouse.  Fall The accident occurred 6 to 12 hours ago. The fall occurred while walking. He fell from a height of 1 to 2 ft. He landed on a hard floor. The patient is experiencing no pain. He was ambulatory at the scene. There was no entrapment after the fall. There was no drug use involved in the accident. There was no alcohol use involved in the accident. Pertinent negatives include no visual change, no numbness, no bowel incontinence, no nausea, no vomiting and no loss of consciousness.    Past Medical History  Diagnosis Date  . Hypertension   . Urinary retention     History reviewed. No pertinent past surgical history.  No family history on file.  History  Substance Use Topics  . Smoking status: Never Smoker   . Smokeless tobacco: Not on file  . Alcohol Use: No      Review of Systems  Constitutional: Positive for activity change.  HENT: Negative for nosebleeds, congestion, neck pain and neck stiffness.   Eyes: Negative for visual disturbance.  Respiratory: Negative for cough and shortness of breath.   Cardiovascular: Negative for chest pain and leg swelling.  Gastrointestinal: Negative for nausea, vomiting and bowel incontinence.  Neurological: Negative for dizziness, loss of consciousness, weakness and numbness.    Allergies  Review of  patient's allergies indicates no known allergies.  Home Medications   Current Outpatient Rx  Name Route Sig Dispense Refill  . ASPIRIN 81 MG PO TABS Oral Take 81 mg by mouth daily.      . BUPROPION HCL ER (XL) 300 MG PO TB24 Oral Take 300 mg by mouth daily.    Marland Kitchen FINASTERIDE 5 MG PO TABS Oral Take 5 mg by mouth daily.      Marland Kitchen LOSARTAN POTASSIUM 100 MG PO TABS Oral Take 100 mg by mouth daily.      [name] PO TABS Oral Take 1 tablet by mouth daily.      . VENLAFAXINE HCL ER 75 MG PO CP24 Oral Take 225 mg by mouth daily.      Marland Kitchen VITAMIN E 400 UNITS PO CAPS Oral Take 400 Units by mouth daily.        BP 117/74  Pulse 105  Temp(Src) 98.3 F (36.8 C) (Oral)  Resp 18  SpO2 96%  Physical Exam  Constitutional: He appears well-developed and well-nourished.  HENT:  Head: Normocephalic.       Hematoma of the right eyebrow that is, in various stages, of healing  Eyes: EOM are normal. Pupils are equal, round, and reactive to light.  Neck: Normal range of motion. Neck supple.  Cardiovascular: Tachycardia present.   Pulmonary/Chest: Effort normal and breath sounds normal.  Abdominal: He exhibits no distension. There is no tenderness.  Musculoskeletal: Normal range of motion. He exhibits no edema and no  tenderness.  Neurological: He is alert. No cranial nerve deficit.    ED Course  Procedures (including critical care time)  Labs Reviewed  CBC - Abnormal; Notable for the following:    WBC 11.3 (*)    RBC 4.12 (*)    HCT 38.3 (*)    All other components within normal limits  URINALYSIS, ROUTINE W REFLEX MICROSCOPIC - Abnormal; Notable for the following:    Color, Urine AMBER (*) BIOCHEMICALS MAY BE AFFECTED BY COLOR   APPearance TURBID (*)    Ketones, ur 15 (*)    Nitrite POSITIVE (*)    Leukocytes, UA LARGE (*)    All other components within normal limits  URINE MICROSCOPIC-ADD ON - Abnormal; Notable for the following:    Bacteria, UA MANY (*)    Crystals CA OXALATE CRYSTALS  (*)    All other components within normal limits  POCT I-STAT, CHEM 8  URINE CULTURE   Ct Head Wo Contrast  09/07/2011  *RADIOLOGY REPORT*  Clinical Data: Altered mental status after a fall.  Abnormal gait. More confused.  CT HEAD WITHOUT CONTRAST  Technique:  Contiguous axial images were obtained from the base of the skull through the vertex without contrast.  Comparison: 03/20/2011  Findings: There is no acute intracranial hemorrhage, infarction, or mass lesion.  Diffuse atrophy with scattered white matter lucencies consistent with small vessel ischemic disease, unchanged.  No osseous abnormality.  IMPRESSION: No acute intracranial abnormalities.  Original Report Authenticated By: Gwynn Burly, M.D.     1. Urinary tract infection   2. Altered mental status       MDM  Due to patient's fall, falls, and increasing confusion.  There is concern for a intracranial hemorrhage.  Will CT as well as CBC i-STAT and urine looking for other potential causes for confusion in an elderly male Head CT scan is normal.  CBC and i-STAT are normal.  Awaiting urine results       Arman Filter, NP 09/08/11 0005

## 2011-09-08 ENCOUNTER — Other Ambulatory Visit: Payer: Self-pay

## 2011-09-08 DIAGNOSIS — D72829 Elevated white blood cell count, unspecified: Secondary | ICD-10-CM | POA: Diagnosis present

## 2011-09-08 DIAGNOSIS — E860 Dehydration: Secondary | ICD-10-CM | POA: Diagnosis present

## 2011-09-08 DIAGNOSIS — N390 Urinary tract infection, site not specified: Secondary | ICD-10-CM | POA: Diagnosis present

## 2011-09-08 DIAGNOSIS — R4182 Altered mental status, unspecified: Secondary | ICD-10-CM | POA: Diagnosis present

## 2011-09-08 DIAGNOSIS — W19XXXA Unspecified fall, initial encounter: Secondary | ICD-10-CM | POA: Diagnosis present

## 2011-09-08 DIAGNOSIS — R55 Syncope and collapse: Secondary | ICD-10-CM

## 2011-09-08 LAB — COMPREHENSIVE METABOLIC PANEL
Albumin: 3.2 g/dL — ABNORMAL LOW (ref 3.5–5.2)
Alkaline Phosphatase: 79 U/L (ref 39–117)
BUN: 15 mg/dL (ref 6–23)
CO2: 26 mEq/L (ref 19–32)
Chloride: 101 mEq/L (ref 96–112)
GFR calc non Af Amer: 69 mL/min — ABNORMAL LOW (ref >90)
Glucose, Bld: 87 mg/dL (ref 70–99)
Potassium: 3.7 mEq/L (ref 3.5–5.1)
Total Bilirubin: 0.7 mg/dL (ref 0.3–1.2)

## 2011-09-08 LAB — CBC
MCV: 93.8 fL (ref 78.0–100.0)
Platelets: 178 10*3/uL (ref 150–400)
RDW: 11.8 % (ref 11.5–15.5)
WBC: 7 10*3/uL (ref 4.0–10.5)

## 2011-09-08 LAB — TSH: TSH: 0.506 u[IU]/mL (ref 0.350–4.500)

## 2011-09-08 LAB — PHOSPHORUS: Phosphorus: 2.3 mg/dL (ref 2.3–4.6)

## 2011-09-08 MED ORDER — ONDANSETRON HCL 4 MG/2ML IJ SOLN: 4.0000 mg | Freq: Four times a day (QID) | INTRAMUSCULAR | Status: DC | PRN

## 2011-09-08 MED ORDER — LOSARTAN POTASSIUM 50 MG PO TABS
100.0000 mg | ORAL_TABLET | Freq: Every day | ORAL | Status: DC
  Administered 2011-09-08 – 2011-09-15 (×8): 100 mg via ORAL
  Filled 2011-09-08 (×8): qty 2

## 2011-09-08 MED ORDER — SODIUM CHLORIDE 0.9 % IV SOLN
Freq: Once | INTRAVENOUS | Status: AC
  Administered 2011-09-08: 04:00:00 via INTRAVENOUS

## 2011-09-08 MED ORDER — ONDANSETRON HCL 4 MG PO TABS: 4.0000 mg | ORAL_TABLET | Freq: Four times a day (QID) | ORAL | Status: DC | PRN

## 2011-09-08 MED ORDER — ASPIRIN EC 81 MG PO TBEC
81.0000 mg | DELAYED_RELEASE_TABLET | Freq: Every day | ORAL | Status: DC
Start: 2011-09-08 — End: 2011-09-15
  Administered 2011-09-08 – 2011-09-15 (×8): 81 mg via ORAL
  Filled 2011-09-08 (×8): qty 1

## 2011-09-08 MED ORDER — SODIUM CHLORIDE 0.9 % IV SOLN
INTRAVENOUS | Status: AC
  Administered 2011-09-08: 04:00:00 via INTRAVENOUS

## 2011-09-08 MED ORDER — ACETAMINOPHEN 325 MG PO TABS
650.0000 mg | ORAL_TABLET | Freq: Four times a day (QID) | ORAL | Status: DC | PRN
  Administered 2011-09-08: 650 mg via ORAL
  Filled 2011-09-08: qty 2

## 2011-09-08 MED ORDER — VENLAFAXINE HCL ER 75 MG PO CP24
225.0000 mg | ORAL_CAPSULE | Freq: Every day | ORAL | Status: DC
  Administered 2011-09-08 – 2011-09-15 (×8): 225 mg via ORAL
  Filled 2011-09-08 (×8): qty 1

## 2011-09-08 MED ORDER — POLYETHYLENE GLYCOL 3350 17 G PO PACK
17.0000 g | PACK | Freq: Every day | ORAL | Status: DC | PRN
  Administered 2011-09-13: 17 g via ORAL
  Filled 2011-09-08 (×2): qty 1

## 2011-09-08 MED ORDER — FINASTERIDE 5 MG PO TABS
5.0000 mg | ORAL_TABLET | Freq: Every day | ORAL | Status: DC
  Administered 2011-09-08 – 2011-09-15 (×8): 5 mg via ORAL
  Filled 2011-09-08 (×8): qty 1

## 2011-09-08 MED ORDER — ENOXAPARIN SODIUM 40 MG/0.4ML ~~LOC~~ SOLN
40.0000 mg | SUBCUTANEOUS | Status: DC
  Administered 2011-09-08 – 2011-09-15 (×8): 40 mg via SUBCUTANEOUS
  Filled 2011-09-08 (×8): qty 0.4

## 2011-09-08 MED ORDER — BUPROPION HCL ER (XL) 300 MG PO TB24
300.0000 mg | ORAL_TABLET | Freq: Every day | ORAL | Status: DC
  Administered 2011-09-08 – 2011-09-09 (×2): 300 mg via ORAL
  Filled 2011-09-08 (×2): qty 1

## 2011-09-08 MED ORDER — ACETAMINOPHEN 650 MG RE SUPP
650.0000 mg | Freq: Four times a day (QID) | RECTAL | Status: DC | PRN
  Administered 2011-09-11: 650 mg via RECTAL
  Filled 2011-09-08: qty 1

## 2011-09-08 MED ORDER — ENSURE CLINICAL ST REVIGOR PO LIQD
237.0000 mL | Freq: Every day | ORAL | Status: DC
  Administered 2011-09-08: 237 mL via ORAL
  Administered 2011-09-09 – 2011-09-11 (×3): via ORAL
  Administered 2011-09-12 – 2011-09-14 (×3): 237 mL via ORAL

## 2011-09-08 MED ORDER — ADULT MULTIVITAMIN W/MINERALS CH
1.0000 | ORAL_TABLET | Freq: Every day | ORAL | Status: DC
  Administered 2011-09-08 – 2011-09-15 (×8): 1 via ORAL
  Filled 2011-09-08 (×8): qty 1

## 2011-09-08 MED ORDER — CEFTRIAXONE SODIUM 1 G IJ SOLR
1.0000 g | INTRAMUSCULAR | Status: DC
  Administered 2011-09-09: 1 g via INTRAVENOUS
  Filled 2011-09-08 (×2): qty 10

## 2011-09-08 MED ORDER — VITAMIN E 180 MG (400 UNIT) PO CAPS
400.0000 [IU] | ORAL_CAPSULE | Freq: Every day | ORAL | Status: DC
  Administered 2011-09-08 – 2011-09-15 (×8): 400 [IU] via ORAL
  Filled 2011-09-08 (×8): qty 1

## 2011-09-08 NOTE — H&P (Signed)
PCP:  DR Durwin Nora  Chief Complaint:  Brought in by wife for fall yesterday afternoon and confusion.   HPI: 78 year old gentleman with h/ofalls in the past was brought by his wife for multiple falls over the last few weeks, and a fall yesterday afternoon. Pt is confused and a poor historian. MOst of the history was available from the wife at bedside. As per the wife, pt spilled juice on the floor, and he was trying to get out of the chair, slipped on the spilled juice and fell. He had no LOC, but has been weak and not able to bear weight on his legs since the fall. He has bruising over his forehead, from multiple falls over the last few weeks, ct head was negative for intracranial bleed. He denies any other complaints. He was found to have UTI and mild leukocytosis. He is being admitted for evaluation of multiple falls, management of UTI.   Review of Systems:  The patient denies anorexia, fever, weight loss,, vision loss, decreased hearing, hoarseness, chest pain, syncope, dyspnea on exertion, peripheral edema, balance deficits, hemoptysis, abdominal pain, melena, hematochezia, severe indigestion/heartburn, hematuria, , genital sores, muscle weakness, suspicious skin lesions, transient blindness,, unusual weight change, abnormal bleeding, enlarged lymph nodes, angioedema, and breast masses.  Past Medical History: Past Medical History  Diagnosis Date  . Hypertension   . Urinary retention    History reviewed. No pertinent past surgical history.  Medications: Prior to Admission medications   Medication Sig Start Date End Date Taking? Authorizing Provider  aspirin 81 MG tablet Take 81 mg by mouth daily.     Yes Historical Provider, MD  buPROPion (WELLBUTRIN XL) 300 MG 24 hr tablet Take 300 mg by mouth daily.   Yes Historical Provider, MD  finasteride (PROSCAR) 5 MG tablet Take 5 mg by mouth daily.     Yes Historical Provider, MD  losartan (COZAAR) 100 MG tablet Take 100 mg by mouth daily.      Yes Historical Provider, MD  Multiple Vitamins-Minerals (MULTIVITAMINS THER. W/MINERALS) TABS Take 1 tablet by mouth daily.     Yes Historical Provider, MD  venlafaxine (EFFEXOR-XR) 75 MG 24 hr capsule Take 225 mg by mouth daily.     Yes Historical Provider, MD  vitamin E (VITAMIN E) 400 UNIT capsule Take 400 Units by mouth daily.     Yes Historical Provider, MD    Allergies:  No Known Allergies  Social History:  reports that he has never smoked. He does not have any smokeless tobacco history on file. He reports that he does not drink alcohol or use illicit drugs.   Family History: No family history on file.  Physical Exam: Filed Vitals:   09/07/11 2037 09/07/11 2254  BP: 134/82 117/74  Pulse: 107 105  Temp: 98.3 F (36.8 C)   TempSrc: Oral   Resp: 18 18  SpO2: 96% 96%   Constitutional: Vital signs reviewed.  Patient is a mal nourished in no acute distress and cooperative with exam. Alertbut ocnfused Head: Normocephalic bruising over the right side of the forehead from the fall Mouth: no erythema or exudates, dry mucous membranes. Eyes: PERRL, EOMI, conjunctivae normal, No scleral icterus.  Neck: Supple, Trachea midline normal ROM, No JVD, mass, thyromegaly, or carotid bruit present.  Cardiovascular: RRR, S1 normal, S2 normal, no MRG, pulses symmetric and intact bilaterally Pulmonary/Chest: CTAB, no wheezes, rales, or rhonchi Abdominal: Soft. Non-tender, non-distended, bowel sounds are normal, no masses, organomegaly, or guarding present.  Musculoskeletal:  No joint deformities, erythema, or stiffness, ROM full and no nontender  Neurological: A lert but confused Strenght is normal and symmetric bilaterally, cranial nerve II-XII are grossly intact ,Skin: Warm, dry and intact. No rash, cyanosis, or clubbing.      Labs on Admission:   [facility] 09/07/11 2132  NA 140  K 4.1  CL 103  CO2 --  GLUCOSE 90  BUN 17  CREATININE 1.20  CALCIUM --  MG --  PHOS --   No  results found for this basename: AST:2,ALT:2,ALKPHOS:2,BILITOT:2,PROT:2,ALBUMIN:2 in the last 72 hours No results found for this basename: LIPASE:2,AMYLASE:2 in the last 72 hours  Basename 09/07/11 2132 09/07/11 2115  WBC -- 11.3*  NEUTROABS -- --  HGB 13.3 13.5  HCT 39.0 38.3*  MCV -- 93.0  PLT -- 203   No results found for this basename: CKTOTAL:3,CKMB:3,CKMBINDEX:3,TROPONINI:3 in the last 72 hours No results found for this basename: TSH,T4TOTAL,FREET3,T3FREE,THYROIDAB in the last 72 hours No results found for this basename: VITAMINB12:2,FOLATE:2,FERRITIN:2,TIBC:2,IRON:2,RETICCTPCT:2 in the last 72 hours  Radiological Exams on Admission: Ct Head Wo Contrast  09/07/2011  *RADIOLOGY REPORT*  Clinical Data: Altered mental status after a fall.  Abnormal gait. More confused.  CT HEAD WITHOUT CONTRAST  Technique:  Contiguous axial images were obtained from the base of the skull through the vertex without contrast.  Comparison: 03/20/2011  Findings: There is no acute intracranial hemorrhage, infarction, or mass lesion.  Diffuse atrophy with scattered white matter lucencies consistent with small vessel ischemic disease, unchanged.  No osseous abnormality.  IMPRESSION: No acute intracranial abnormalities.  Original Report Authenticated By: Gwynn Burly, M.D.    Assessment/Plan Present on Admission:  .Altered mental status: most likely from UTI.  Marland KitchenUTI (lower urinary tract infection); urine cultures sent. On rocephin.  Fall; multiple falls over the last few weeks.  Initial CT head negative for acute intracranial pathology [facility] on admission. Admit to tele to evaluate for arrythmia's. 12 lead EKG. 2 D Echocardiogram.  PT/OTevaluation.  Leukocytosis: secondary to UTI. On rocephin.  Dehydration: gentle hydration.  Depression: continue with home medications. DVT prophylaxis: Lovenox.    Time spent on this patient including examination and decision-making process: 52  minutes.  Donte Kary [phone] 09/08/2011, 12:50 AM

## 2011-09-08 NOTE — Progress Notes (Signed)
INITIAL ADULT NUTRITION ASSESSMENT Date: 09/08/2011   Time: 3:03 PM Reason for Assessment: consult  ASSESSMENT: Male 78 y.o.  Dx: Altered mental status  Hx:  Past Medical History  Diagnosis Date  . Hypertension   . Urinary retention    History reviewed. No pertinent past surgical history.  Related Meds:  Scheduled Meds:   . sodium chloride   Intravenous Once  . sodium chloride   Intravenous STAT  . aspirin EC  81 mg Oral Daily  . buPROPion  300 mg Oral Daily  . cefTRIAXone (ROCEPHIN)  IV  1 g Intravenous Q24H  . cefTRIAXone (ROCEPHIN) IM  1 g Intramuscular Once  . cephALEXin  250 mg Oral Once  . enoxaparin  40 mg Subcutaneous Q24H  . finasteride  5 mg Oral Daily  . lidocaine      . losartan  100 mg Oral Daily  . mulitivitamin with minerals  1 tablet Oral Daily  . venlafaxine  225 mg Oral Daily  . vitamin E  400 Units Oral Daily   Continuous Infusions:  PRN Meds:.acetaminophen, acetaminophen, ondansetron (ZOFRAN) IV, ondansetron, polyethylene glycol   Ht: 6' (182.9 cm)  Wt: 141 lb 15.6 oz (64.4 kg)  Ideal Wt: 80.2 kg % Ideal Wt: 80%  Usual Wt: >200 lbs >5 yrs ago.  Since then, progressive wt loss with no plateau.  Body mass index is 19.26 kg/(m^2).  Food/Nutrition Related Hx: frequent falls  Labs:  CMP     Component Value Date/Time   NA 134* 09/08/2011 0424   K 3.7 09/08/2011 0424   CL 101 09/08/2011 0424   CO2 26 09/08/2011 0424   GLUCOSE 87 09/08/2011 0424   BUN 15 09/08/2011 0424   CREATININE 1.02 09/08/2011 0424   CALCIUM 8.4 09/08/2011 0424   PROT 5.7* 09/08/2011 0424   ALBUMIN 3.2* 09/08/2011 0424   AST 16 09/08/2011 0424   ALT 15 09/08/2011 0424   ALKPHOS 79 09/08/2011 0424   BILITOT 0.7 09/08/2011 0424   GFRNONAA 69* 09/08/2011 0424   GFRAA 80* 09/08/2011 0424    Intake:  50% Output: 2 BMs today  Intake/Output Summary (Last 24 hours) at 09/08/11 1558 Last data filed at 09/08/11 1100  Gross per 24 hour  Intake    200 ml  Output    500 ml  Net   -300 ml     Diet Order: Sodium Restricted  Supplements/Tube Feeding:  None at this time  IVF:    Estimated Nutritional Needs:   Kcal: 1930-2250 kcal Protein: 77-90g Fluid: >2.0 L/day  Pt with poor PO intake at home.  Wife reports pt consumes 1 bowl of oatmeal in the morning, a peanut butter and jelly sandwich at lunch with chips, and a [place] sandwich with chips for dinner.  Pt used to drink some juices and Ensure, used to eat fruits and vegetables however has stopped eating them and sticks to meal regimen.  Pt had a hamburger today which wife states is very unusual.  Pt reports if he doesn't eat this food he will not have a bowel movement- very concerned about getting stopped up even after having 2 BMs today.  Pt oriented and provides accurate information at times, then disoriented, not providing logical answers to questions at times. Pt does take a multivitamin- wife reports as Surveyor, quantity.  Wife reports pt used to be >200 lbs, but since depression dx approximately 5 years ago has progressively lost wt and become very narrow in food choices/preferences.  Pt used  to drink Ensure.  Interested in resuming.  Pt's BMI is wnl, however suboptimal for age and build.  NUTRITION DIAGNOSIS: -Inadequate oral intake (NI-2.1).  Status: Ongoing  RELATED TO: confusion, anorexia  AS EVIDENCE BY: pt/wife report, wt loss  MONITORING/EVALUATION(Goals): 1.  Food/Beverage; pt to consume >50% of meals.  EDUCATION NEEDS: -Education needs addressed with family  INTERVENTION: 1. General healthful diet; encourage intake as able.  Pt may benefit from liberalizing diet to Regular due to poor intake and restrictive diet. Encouraged safe activity with PT to improve appetite and stimulate BMs. Pt with frequent falls. 2.  Supplements; Ensure Complete hs, chocolate  Dietitian #: [phone]  DOCUMENTATION CODES Per approved criteria  -Underweight    Juan Diaz 09/08/2011, 3:03 PM

## 2011-09-08 NOTE — ED Notes (Signed)
MD at bedside. Hospitalist at bedside. 

## 2011-09-08 NOTE — Progress Notes (Signed)
  Echocardiogram 2D Echocardiogram has been performed.  Juan Diaz [facility] 09/08/2011, 9:15 AM

## 2011-09-08 NOTE — Progress Notes (Signed)
Subjective: Patient's wife at bedside to my examination. The patient is feeling of anhedonia. The patient's wife also reports that the patient has more bad days than good days where he spends most of his time in bed and he sustained meals every day. The patient has been reluctant to see a psychiatrist because he feels that he has been used as a [place] pig in the past. I spoke with the patient and his wife at length and I will asked the psychiatric social worker to see the patient for an independent assessment.  Objective: Filed Vitals:   09/08/11 0310 09/08/11 0530 09/08/11 0532 09/08/11 0533  BP: 125/68 117/70 107/69 107/71  Pulse: 96 87 87 87  Temp: 98.5 F (36.9 C) 97.8 F (36.6 C)    TempSrc: Oral Oral    Resp: 24 20    Height: 6' (1.829 m)     Weight: 64.4 kg (141 lb 15.6 oz)     SpO2: 94% 96%     Weight change:   Intake/Output Summary (Last 24 hours) at 09/08/11 1949 Last data filed at 09/08/11 1927  Gross per 24 hour  Intake    680 ml  Output    675 ml  Net      5 ml    General: Alert, awake, oriented x3, in no acute distress.  HEENT: [place]/AT PEERL, EOMI Neck: Trachea midline,  no masses, no thyromegal,y no JVD, no carotid bruit OROPHARYNX:  Moist, No exudate/ erythema/lesions.  Heart: Regular rate and rhythm, without murmurs, rubs, gallops, PMI non-displaced, no heaves or thrills on palpation.  Lungs: Clear to auscultation, no wheezing or rhonchi noted. No increased vocal fremitus resonant to percussion  Abdomen: Soft, nontender, nondistended, positive bowel sounds, no masses no hepatosplenomegaly noted..  Neuro: No focal neurological deficits noted cranial nerves II through XII grossly intact. DTRs 2+ bilaterally upper and lower extremities. Strength functional in bilateral upper and lower extremities. Musculoskeletal: No warm swelling or erythema around joints, no spinal tenderness noted. Psychiatric: Patient alert and oriented x3. He has pressured speech and has  expressions of paranoia during his conversation. l.   Lab Results:  Basename 09/08/11 0424 09/07/11 2132  NA 134* 140  K 3.7 4.1  CL 101 103  CO2 26 --  GLUCOSE 87 90  BUN 15 17  CREATININE 1.02 1.20  CALCIUM 8.4 --  MG 2.0 --  PHOS 2.3 --    Basename 09/08/11 0424  AST 16  ALT 15  ALKPHOS 79  BILITOT 0.7  PROT 5.7*  ALBUMIN 3.2*   No results found for this basename: LIPASE:2,AMYLASE:2 in the last 72 hours  Basename 09/08/11 0424 09/07/11 2132 09/07/11 2115  WBC 7.0 -- 11.3*  NEUTROABS -- -- --  HGB 12.0* 13.3 --  HCT 34.5* 39.0 --  MCV 93.8 -- 93.0  PLT 178 -- 203   No results found for this basename: CKTOTAL:3,CKMB:3,CKMBINDEX:3,TROPONINI:3 in the last 72 hours No components found with this basename: POCBNP:3 No results found for this basename: DDIMER:2 in the last 72 hours No results found for this basename: HGBA1C:2 in the last 72 hours No results found for this basename: CHOL:2,HDL:2,LDLCALC:2,TRIG:2,CHOLHDL:2,LDLDIRECT:2 in the last 72 hours  Basename 09/08/11 0424  TSH 0.506  T4TOTAL --  T3FREE --  THYROIDAB --   No results found for this basename: VITAMINB12:2,FOLATE:2,FERRITIN:2,TIBC:2,IRON:2,RETICCTPCT:2 in the last 72 hours  Micro Results: No results found for this or any previous visit (from the past 240 hour(s)).  Studies/Results: Ct Head Wo Contrast  09/07/2011  *  RADIOLOGY REPORT*  Clinical Data: Altered mental status after a fall.  Abnormal gait. More confused.  CT HEAD WITHOUT CONTRAST  Technique:  Contiguous axial images were obtained from the base of the skull through the vertex without contrast.  Comparison: 03/20/2011  Findings: There is no acute intracranial hemorrhage, infarction, or mass lesion.  Diffuse atrophy with scattered white matter lucencies consistent with small vessel ischemic disease, unchanged.  No osseous abnormality.  IMPRESSION: No acute intracranial abnormalities.  Original Report Authenticated By: Gwynn Burly, M.D.     Medications: I have reviewed the patient's current medications. Scheduled Meds:   . sodium chloride   Intravenous Once  . sodium chloride   Intravenous STAT  . aspirin EC  81 mg Oral Daily  . buPROPion  300 mg Oral Daily  . cefTRIAXone (ROCEPHIN)  IV  1 g Intravenous Q24H  . cefTRIAXone (ROCEPHIN) IM  1 g Intramuscular Once  . cephALEXin  250 mg Oral Once  . enoxaparin  40 mg Subcutaneous Q24H  . feeding supplement  237 mL Oral QHS  . finasteride  5 mg Oral Daily  . lidocaine      . losartan  100 mg Oral Daily  . mulitivitamin with minerals  1 tablet Oral Daily  . venlafaxine  225 mg Oral Daily  . vitamin E  400 Units Oral Daily   Continuous Infusions:  PRN Meds:.acetaminophen, acetaminophen, ondansetron (ZOFRAN) IV, ondansetron, polyethylene glycol Assessment/Plan: Patient Active Hospital Problem List: Altered mental status (09/08/2011)   Assessment: This is resolved.    UTI (lower urinary tract infection) (09/08/2011)   Assessment: Urine culture pending continue current antibiotics.    Fall (09/08/2011)   Assessment: Local therapy evaluated the patient.    Leukocytosis (09/08/2011)   Assessment: Associated with urinary tract infection continue antibiotic   Dehydration (09/08/2011)   Assessment: Improved with IV hydration    Depression (09/08/2011)   Assessment: Patient has features of depression with paranoia. I have asked the Psychiatric social worker to evaluate the patient in hopes that she may convince the patient to accept a psychiatric evaluation during this hospitalization. This is been discussed with the patient and his wife.   LOS: 1 day

## 2011-09-08 NOTE — Evaluation (Addendum)
Physical Therapy Evaluation Patient Details Name: Juan Diaz MRN: [id number] DOB: 05/21/1933 Today's Date: 09/08/2011  Problem List:  Patient Active Problem List  Diagnoses  . Altered mental status  . UTI (lower urinary tract infection)  . Fall  . Leukocytosis  . Dehydration    Past Medical History:  Past Medical History  Diagnosis Date  . Hypertension   . Urinary retention    Past Surgical History: History reviewed. No pertinent past surgical history.  PT Assessment/Plan/Recommendation PT Assessment Clinical Impression Statement: Pt presents with diagnosis of AMS. Pt still appears confused and conversation is not always on topic at hand. However pt participated well with session-requirerd Minimal encouragement . Pt will benefit from skilled PT in acute setting to improve general strength, activity tolerance, gait and balance in preparation for D/C home with wife.  PT Recommendation/Assessment: Patient will need skilled PT in the acute care venue PT Problem List: Decreased strength;Decreased activity tolerance;Decreased balance;Decreased mobility;Decreased knowledge of use of DME;Decreased safety awareness;Decreased cognition PT Therapy Diagnosis : Difficulty walking;Generalized weakness PT Plan PT Frequency: Min 3X/week PT Treatment/Interventions: DME instruction;Gait training;Functional mobility training;Stair training;Therapeutic activities;Therapeutic exercise;Balance training;Patient/family education PT Recommendation Recommendations for Other Services: OT consult Follow Up Recommendations: Outpatient PT (pt's wife prefers OP PT (states pt will not want anyone in home for HHPT) Equipment Recommended: None recommended by PT PT Goals  Acute Rehab PT Goals PT Goal Formulation: With patient/family Time For Goal Achievement: 7 days Pt will go Supine/Side to Sit: with supervision PT Goal: Supine/Side to Sit - Progress: Goal set today Pt will go Sit to Supine/Side: with  supervision PT Goal: Sit to Supine/Side - Progress: Goal set today Pt will go Sit to Stand: with supervision PT Goal: Sit to Stand - Progress: Goal set today Pt will go Stand to Sit: with supervision PT Goal: Stand to Sit - Progress: Goal set today Pt will Ambulate: 51 - 150 feet;with supervision;with least restrictive assistive device PT Goal: Ambulate - Progress: Goal set today Pt will Go Up / Down Stairs: 1-2 stairs;with least restrictive assistive device (1 step) PT Goal: Up/Down Stairs - Progress: Goal set today Pt will Perform Home Exercise Program: with supervision, verbal cues required/provided PT Goal: Perform Home Exercise Program - Progress: Goal set today  PT Evaluation Precautions/Restrictions  Precautions Precautions: Fall Prior Functioning  Home Living Lives With: Spouse Receives Help From: Family Type of Home: House Home Layout: One level Home Access: Stairs to enter Entrance Stairs-Rails: None Entrance Stairs-Number of Steps: 1 Home Adaptive Equipment: Walker - rolling;Crutches;Straight cane Prior Function Level of Independence: Independent with gait;Independent with transfers;Needs assistance with homemaking Cognition Cognition Arousal/Alertness: Lethargic Overall Cognitive Status: Appears within functional limits for tasks assessed Orientation Level: Oriented to person;Oriented to place Sensation/Coordination Sensation Light Touch: Appears Intact Coordination Gross Motor Movements are Fluid and Coordinated: Yes Extremity Assessment RLE Assessment RLE Assessment: Within Functional Limits LLE Assessment LLE Assessment: Within Functional Limits Mobility (including Balance) Bed Mobility Bed Mobility: Yes Supine to Sit: HOB flat;With rails Supine to Sit Details (indicate cue type and reason): Min-guard assist. VCs safety. Increased time.  Transfers Transfers: Yes Sit to Stand: 4: Min assist;With upper extremity assist;From bed Sit to Stand Details  (indicate cue type and reason): x 2. VCs safety, technique, hand placement. assist to rise, stabilize.  Stand to Sit: 4: Min assist;With upper extremity assist;To chair/3-in-1;To bed Stand to Sit Details: x2. Vcs safety, technique, hand placement. Assist to control descent.  Ambulation/Gait Ambulation/Gait: Yes Ambulation/Gait Assistance: 4: Min assist Ambulation/Gait  Assistance Details (indicate cue type and reason): VCs safety, posture, to increase step length. slow gait speed. Pt declined to ambulate further. Ambulation Distance (Feet): 60 Feet Assistive device: Rolling walker Gait Pattern: Shuffle;Trunk flexed;Step-through pattern  Posture/Postural Control Posture/Postural Control: No significant limitations Balance Balance Assessed:  (unsteady) Exercise    End of Session PT - End of Session Equipment Utilized During Treatment: Gait belt Activity Tolerance: Patient limited by fatigue Patient left: in chair;with call bell in reach;with family/visitor present General Behavior During Session: [facility] for tasks performed Cognition: [facility] for tasks performed  [name] Alert [facility] 09/08/2011, 12:23 PM [phone]

## 2011-09-08 NOTE — ED Provider Notes (Signed)
Medical screening examination/treatment/procedure(s) were conducted as a shared visit with non-physician practitioner(s) and myself.  I personally evaluated the patient during the encounter  Mechanical fall at home.  Not on coumadin.  INcreased confusion per wife.  Mild tachycardia.  Glynn Octave, MD 09/08/11 1007

## 2011-09-08 NOTE — Progress Notes (Signed)
Spoke with Pt and wife at length re: Pt's mental health.  Pt reports debilitating depression that includes anhedonia, apathy, lethargy and lack of appetite.  Pt reports that he's experienced these sxs for over 5 years, save for a small window of time when he was on Effexor and Seroquel.  Pt was taken off of Seroquel in September 2012, as, it's believed, that he had a reaction to a "bad batch" of this med that resulted in 2 hospitalizations in 2 days.  Since this incident, Pt has been reluctant to see as psychiatrist.  He has seen 3 in the past, including Dr. Tomasa Rand and [facility], but now has the opinion that they treat pt's as [place] pigs and doesn't feel that they know what they're doing, due to the fact that they have to have Pt's try several different drugs before finding the appropriate regimen.  Wife appeared exasperated with Pt and voiced her concerns regarding him laying in bed all day, eating the same meals daily and his lack of enjoyment of life.  She is concerned, too, about Pt's rambling and confusion re: current and past situations.  CSW witnessed Pt confusion, as he often asked his wife about their schedule tomorrow (tomorrow is a Saturday) and he shared with CSW events in his life, past and present, that weren't accurate or true.  MSE:  Mood: depressed Affect: flat, irritated, angry, frustrated Appearance: disheleved Memory: remote and current impaired Thinking: disorganized Psychosis: reported that he has hx of visual hallucinations.  Wife corroborated this information and stated that his hallucinations come in "spirts" and that there seems to be a positive correlation between his depression and his hallucinations.   SI: by hx, none present. HI: by hx, none present.  Pt agreed to meet with a psychiatrist.  CSW thanked Pt and his wife for their time.  Notified MD.  MD to request psych consult.  Providence Crosby, LCSWA Clinical Social Work [phone]

## 2011-09-09 DIAGNOSIS — F05 Delirium due to known physiological condition: Secondary | ICD-10-CM

## 2011-09-09 DIAGNOSIS — F333 Major depressive disorder, recurrent, severe with psychotic symptoms: Secondary | ICD-10-CM

## 2011-09-09 LAB — CBC
HCT: 33.9 % — ABNORMAL LOW (ref 39.0–52.0)
MCHC: 35.1 g/dL (ref 30.0–36.0)
MCV: 93.6 fL (ref 78.0–100.0)
RDW: 12 % (ref 11.5–15.5)

## 2011-09-09 LAB — BASIC METABOLIC PANEL
BUN: 11 mg/dL (ref 6–23)
CO2: 26 mEq/L (ref 19–32)
Chloride: 102 mEq/L (ref 96–112)
Glucose, Bld: 83 mg/dL (ref 70–99)
Potassium: 3.8 mEq/L (ref 3.5–5.1)

## 2011-09-09 LAB — DIFFERENTIAL
Lymphocytes Relative: 6 % — ABNORMAL LOW (ref 12–46)
Lymphs Abs: 0.4 10*3/uL — ABNORMAL LOW (ref 0.7–4.0)
Monocytes Relative: 8 % (ref 3–12)
Neutro Abs: 5.7 10*3/uL (ref 1.7–7.7)
Neutrophils Relative %: 84 % — ABNORMAL HIGH (ref 43–77)

## 2011-09-09 LAB — URINE CULTURE: Special Requests: NORMAL

## 2011-09-09 MED ORDER — ARIPIPRAZOLE 2 MG PO TABS
2.0000 mg | ORAL_TABLET | Freq: Two times a day (BID) | ORAL | Status: DC
  Administered 2011-09-09 – 2011-09-15 (×11): 2 mg via ORAL
  Filled 2011-09-09 (×13): qty 1

## 2011-09-09 MED ORDER — LORAZEPAM 2 MG/ML IJ SOLN
1.0000 mg | Freq: Four times a day (QID) | INTRAMUSCULAR | Status: DC | PRN
  Administered 2011-09-09 – 2011-09-11 (×3): 1 mg via INTRAVENOUS
  Filled 2011-09-09 (×4): qty 1

## 2011-09-09 MED ORDER — BUPROPION HCL ER (XL) 150 MG PO TB24
150.0000 mg | ORAL_TABLET | Freq: Every day | ORAL | Status: DC
  Administered 2011-09-10 – 2011-09-15 (×6): 150 mg via ORAL
  Filled 2011-09-09 (×6): qty 1

## 2011-09-09 NOTE — Progress Notes (Signed)
Occupational Therapy Evaluation Patient Details Name: Juan Diaz MRN: [id number] DOB: 26-Jan-1933 Today's Date: 09/09/2011  Problem List:  Patient Active Problem List  Diagnoses  . Altered mental status  . UTI (lower urinary tract infection)  . Fall  . Leukocytosis  . Dehydration    Past Medical History:  Past Medical History  Diagnosis Date  . Hypertension   . Urinary retention    Past Surgical History: History reviewed. No pertinent past surgical history.  OT Assessment/Plan/Recommendation OT Assessment Clinical Impression Statement: Pt presents to OT with decreased I with ADL activity s/p hospitalization for AMS.  Pt currently needs significant assist with ADL activity and will benefit from OT to increase and return to PLOF and lessen burden on wife who works OT Recommendation/Assessment: Patient will need skilled OT in the acute care venue OT Problem List: Decreased strength;Decreased activity tolerance;Impaired balance (sitting and/or standing);Decreased safety awareness;Decreased knowledge of use of DME or AE Barriers to Discharge: Decreased caregiver support Barriers to Discharge Comments: wife works. Pt will need  STSNF OT Therapy Diagnosis : Generalized weakness OT Plan OT Treatment/Interventions: Self-care/ADL training;DME and/or AE instruction;Patient/family education OT Recommendation Follow Up Recommendations: Skilled nursing facility Equipment Recommended: Defer to next venue Individuals Consulted Consulted and Agree with Results and Recommendations: Family member/caregiver Family Member Consulted: wife. OT Goals Acute Rehab OT Goals OT Goal Formulation: With patient/family ADL Goals Pt Will Perform Grooming: with supervision;Standing at sink ADL Goal: Grooming - Progress: Goal set today Pt Will Transfer to Toilet: with supervision;Ambulation;Comfort height toilet ADL Goal: Toilet Transfer - Progress: Goal set today Pt Will Perform Toileting - Clothing  Manipulation: with supervision;Standing ADL Goal: Toileting - Clothing Manipulation - Progress: Goal set today Pt Will Perform Toileting - Hygiene: with supervision;Standing at 3-in-1/toilet ADL Goal: Toileting - Hygiene - Progress: Goal set today  OT Evaluation Precautions/Restrictions  Precautions Precautions: Fall Precaution Comments: Pt crosses legs and keeps feet very close together while sitting and standing Restrictions Weight Bearing Restrictions: No Prior Functioning Home Living Lives With: Spouse Receives Help From: Family Type of Home: House Home Layout: One level Home Access: Stairs to enter Entrance Stairs-Rails: None Entrance Stairs-Number of Steps: 1 Home Adaptive Equipment: Walker - rolling;Crutches;Straight cane Prior Function Level of Independence: Independent with gait;Independent with transfers;Needs assistance with homemaking ADL ADL Grooming: Simulated;Shaving;Moderate assistance Where Assessed - Grooming: Sitting, chair;Supported Upper Body Bathing: Simulated;Moderate assistance Where Assessed - Upper Body Bathing: Supported;Sitting, chair Lower Body Bathing: Simulated;Maximal assistance Where Assessed - Lower Body Bathing: Other (comment) (standing EOB) Upper Body Dressing: Simulated;Minimal assistance Where Assessed - Upper Body Dressing: Sitting, bed;Unsupported Lower Body Dressing: Simulated;Maximal assistance Where Assessed - Lower Body Dressing: Standing Toilet Transfer Method: Stand pivot;Other (comment) (bed to chair only) Toileting - Clothing Manipulation: Maximal assistance Where Assessed - Toileting Clothing Manipulation: Standing Toileting - Hygiene: Simulated Where Assessed - Toileting Hygiene: Standing Tub/Shower Transfer: Maximal assistance Tub/Shower Transfer Method: Not assessed Ambulation Related to ADLs: Pt legs very stiff and hard to keep apart during transfer and sitting EOB.  Pt tends to cross legs and scissor them in standing.   Wife reports he was walking I lly at home.  ADL Comments: Will need significant assist at home with ADL activity at this time. Wife works. Interested in SNF. SW notified      Extremity Assessment RUE Assessment RUE Assessment: Within Functional Limits LUE Assessment LUE Assessment: Within Functional Limits Mobility  Bed Mobility Supine to Sit: 4: Min assist Transfers Sit to Stand: 3: Mod assist;Without upper extremity assist  Stand to Sit: 3: Mod assist;With upper extremity assist    End of Session OT - End of Session Activity Tolerance: Patient tolerated treatment well Patient left: in chair General Behavior During Session: [facility] for tasks performed (needed max encouragement) Cognition: WFL for tasks performed   Hannah Crill, [place]ung 09/09/2011, 1:37 PM

## 2011-09-09 NOTE — Consult Note (Signed)
Reason for Consult:depression, delirium with paranoia. Referring Physician: Dr. Norman Clay Juan Diaz is an 78 y.o. male.  HPI: This is a 78 years old Caucasian male admitted to the [facility] floor with the multiple medical problems including urinary tract infection altered mental status orthostatic mucositis his dehydration depression. Patient was unable to provide a linear and current history during this evaluation. Patient wife is next to him and able to provide the most of these psychiatric history. Patient reportedly has been depressed over 5 years with the severe leave withdrawn a 30 and lack of enjoyment in his life patient. Patient reportedly was refusing to see a psychiatrist it off from one to 3 visits and then stating the psychiatric medication from from family doctor Dr. Tiburcio Pea at [facility]. Patient to medications her venlafaxine extended release 225 mg daily which he was taking for years and then he restarted new medication Wellbutrin XL 150 mg and then later increased to 300 mg because his positively responded. Reportedly patient the fell at home when his sleep and on his shoes take his head required to be hospitalized. His the scans of head indicated no acute intracranial abnormalities. Patient has receiving the treatment for urinary tract infection. Reviewed charts and is discussed with the patient wife.  Mental status exam: Patient appeared to be confused paranoid and able to provide linear and goal-directed history his asking the family going to lose job when will you be coming back to his wife and his asking repeatedly. He is also asking repeatedly to use the restroom. Patient to the stated he does not want to talk during this is visit and stated it is none of somebody else's business. Patient has served talking under his breath and sometimes difficult to follow through he seems to be paranoid but not hallucination should.  Past Medical History  Diagnosis  Date  . Hypertension   . Urinary retention     History reviewed. No pertinent past surgical history.  No family history on file.  Social History:  reports that he has never smoked. He does not have any smokeless tobacco history on file. He reports that he does not drink alcohol or use illicit drugs.  Allergies: No Known Allergies  Medications: I have reviewed the patient's current medications.  Results for orders placed during the hospital encounter of 09/07/11 (from the past 48 hour(s))  CBC     Status: Abnormal   Collection Time   09/07/11  9:15 PM      Component Value Range Comment   WBC 11.3 (*) 4.0 - 10.5 (K/uL)    RBC 4.12 (*) 4.22 - 5.81 (MIL/uL)    Hemoglobin 13.5  13.0 - 17.0 (g/dL)    HCT 40.9 (*) 81.1 - 52.0 (%)    MCV 93.0  78.0 - 100.0 (fL)    MCH 32.8  26.0 - 34.0 (pg)    MCHC 35.2  30.0 - 36.0 (g/dL)    RDW 91.4  78.2 - 95.6 (%)    Platelets 203  150 - 400 (K/uL)   POCT I-STAT, CHEM 8     Status: Normal   Collection Time   09/07/11  9:32 PM      Component Value Range Comment   Sodium 140  135 - 145 (mEq/L)    Potassium 4.1  3.5 - 5.1 (mEq/L)    Chloride 103  96 - 112 (mEq/L)    BUN 17  6 - 23 (mg/dL)    Creatinine, Ser  1.20  0.50 - 1.35 (mg/dL)    Glucose, Bld 90  70 - 99 (mg/dL)    Calcium, Ion 1.61  1.12 - 1.32 (mmol/L)    TCO2 27  0 - 100 (mmol/L)    Hemoglobin 13.3  13.0 - 17.0 (g/dL)    HCT 09.6  04.5 - 40.9 (%)   URINALYSIS, ROUTINE W REFLEX MICROSCOPIC     Status: Abnormal   Collection Time   09/07/11 10:01 PM      Component Value Range Comment   Color, Urine AMBER (*) YELLOW  BIOCHEMICALS MAY BE AFFECTED BY COLOR   APPearance TURBID (*) CLEAR     Specific Gravity, Urine 1.024  1.005 - 1.030     pH 7.0  5.0 - 8.0     Glucose, UA NEGATIVE  NEGATIVE (mg/dL)    Hgb urine dipstick NEGATIVE  NEGATIVE     Bilirubin Urine NEGATIVE  NEGATIVE     Ketones, ur 15 (*) NEGATIVE (mg/dL)    Protein, ur NEGATIVE  NEGATIVE (mg/dL)    Urobilinogen, UA 1.0  0.0 -  1.0 (mg/dL)    Nitrite POSITIVE (*) NEGATIVE     Leukocytes, UA LARGE (*) NEGATIVE    URINE MICROSCOPIC-ADD ON     Status: Abnormal   Collection Time   09/07/11 10:01 PM      Component Value Range Comment   WBC, UA TOO NUMEROUS TO COUNT  <3 (WBC/hpf)    Bacteria, UA MANY (*) RARE     Crystals CA OXALATE CRYSTALS (*) NEGATIVE     Urine-Other MUCOUS PRESENT   AMORPHOUS URATES/PHOSPHATES  URINE CULTURE     Status: Normal   Collection Time   09/07/11 10:10 PM      Component Value Range Comment   Specimen Description URINE, RANDOM      Special Requests Normal      Culture  Setup Time [id number]      Colony Count NO GROWTH      Culture NO GROWTH      Report Status 09/09/2011 FINAL     COMPREHENSIVE METABOLIC PANEL     Status: Abnormal   Collection Time   09/08/11  4:24 AM      Component Value Range Comment   Sodium 134 (*) 135 - 145 (mEq/L)    Potassium 3.7  3.5 - 5.1 (mEq/L)    Chloride 101  96 - 112 (mEq/L)    CO2 26  19 - 32 (mEq/L)    Glucose, Bld 87  70 - 99 (mg/dL)    BUN 15  6 - 23 (mg/dL)    Creatinine, Ser 2.13  0.50 - 1.35 (mg/dL)    Calcium 8.4  8.4 - 10.5 (mg/dL)    Total Protein 5.7 (*) 6.0 - 8.3 (g/dL)    Albumin 3.2 (*) 3.5 - 5.2 (g/dL)    AST 16  0 - 37 (U/L)    ALT 15  0 - 53 (U/L)    Alkaline Phosphatase 79  39 - 117 (U/L)    Total Bilirubin 0.7  0.3 - 1.2 (mg/dL)    GFR calc non Af Amer 69 (*) >90 (mL/min)    GFR calc Af Amer 80 (*) >90 (mL/min)   CBC     Status: Abnormal   Collection Time   09/08/11  4:24 AM      Component Value Range Comment   WBC 7.0  4.0 - 10.5 (K/uL)    RBC 3.68 (*) 4.22 - 5.81 (MIL/uL)  Hemoglobin 12.0 (*) 13.0 - 17.0 (g/dL)    HCT 16.1 (*) 09.6 - 52.0 (%)    MCV 93.8  78.0 - 100.0 (fL)    MCH 32.6  26.0 - 34.0 (pg)    MCHC 34.8  30.0 - 36.0 (g/dL)    RDW 04.5  40.9 - 81.1 (%)    Platelets 178  150 - 400 (K/uL)   TSH     Status: Normal   Collection Time   09/08/11  4:24 AM      Component Value Range Comment   TSH 0.506  0.350 -  4.500 (uIU/mL)   MAGNESIUM     Status: Normal   Collection Time   09/08/11  4:24 AM      Component Value Range Comment   Magnesium 2.0  1.5 - 2.5 (mg/dL)   PHOSPHORUS     Status: Normal   Collection Time   09/08/11  4:24 AM      Component Value Range Comment   Phosphorus 2.3  2.3 - 4.6 (mg/dL)   CBC     Status: Abnormal   Collection Time   09/09/11  5:33 AM      Component Value Range Comment   WBC 6.7  4.0 - 10.5 (K/uL)    RBC 3.62 (*) 4.22 - 5.81 (MIL/uL)    Hemoglobin 11.9 (*) 13.0 - 17.0 (g/dL)    HCT 91.4 (*) 78.2 - 52.0 (%)    MCV 93.6  78.0 - 100.0 (fL)    MCH 32.9  26.0 - 34.0 (pg)    MCHC 35.1  30.0 - 36.0 (g/dL)    RDW 95.6  21.3 - 08.6 (%)    Platelets 161  150 - 400 (K/uL)   DIFFERENTIAL     Status: Abnormal   Collection Time   09/09/11  5:33 AM      Component Value Range Comment   Neutrophils Relative 84 (*) 43 - 77 (%)    Neutro Abs 5.7  1.7 - 7.7 (K/uL)    Lymphocytes Relative 6 (*) 12 - 46 (%)    Lymphs Abs 0.4 (*) 0.7 - 4.0 (K/uL)    Monocytes Relative 8  3 - 12 (%)    Monocytes Absolute 0.5  0.1 - 1.0 (K/uL)    Eosinophils Relative 2  0 - 5 (%)    Eosinophils Absolute 0.1  0.0 - 0.7 (K/uL)    Basophils Relative 0  0 - 1 (%)    Basophils Absolute 0.0  0.0 - 0.1 (K/uL)   BASIC METABOLIC PANEL     Status: Abnormal   Collection Time   09/09/11  5:33 AM      Component Value Range Comment   Sodium 137  135 - 145 (mEq/L)    Potassium 3.8  3.5 - 5.1 (mEq/L)    Chloride 102  96 - 112 (mEq/L)    CO2 26  19 - 32 (mEq/L)    Glucose, Bld 83  70 - 99 (mg/dL)    BUN 11  6 - 23 (mg/dL)    Creatinine, Ser 5.78  0.50 - 1.35 (mg/dL)    Calcium 8.6  8.4 - 10.5 (mg/dL)    GFR calc non Af Amer 80 (*) >90 (mL/min)    GFR calc Af Amer >90  >90 (mL/min)     Ct Head Wo Contrast  09/07/2011  *RADIOLOGY REPORT*  Clinical Data: Altered mental status after a fall.  Abnormal gait. More confused.  CT HEAD WITHOUT CONTRAST  Technique:  Contiguous axial images were obtained from the base  of the skull through the vertex without contrast.  Comparison: 03/20/2011  Findings: There is no acute intracranial hemorrhage, infarction, or mass lesion.  Diffuse atrophy with scattered white matter lucencies consistent with small vessel ischemic disease, unchanged.  No osseous abnormality.  IMPRESSION: No acute intracranial abnormalities.  Original Report Authenticated By: Gwynn Burly, M.D.    Patient appeared anxious depressed and paranoid during this she'll visit. Blood pressure 126/63, pulse 94, temperature 97.5 F (36.4 C), temperature source Oral, resp. rate 20, height 6' (1.829 m), weight 64.4 kg (141 lb 15.6 oz), SpO2 97.00%.   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 Will decrease his Wellbutrin 150 mg and the add Abilify 2 mg 2 times a day for better current symptom control of depression and paranoia. Patient wife agrees with the treatment plan. Patient will be followed by psychiatric consult as needed. It is difficult to determine at this time and need of geriatric psychiatric hospitalization.  Tiron Suski,JANARDHAHA R. 09/09/2011, 1:57 PM

## 2011-09-09 NOTE — Progress Notes (Signed)
Subjective: Patient's wife present. Pt is confused today and appears to have some acute delirium.  Objective: Filed Vitals:   09/08/11 0533 09/08/11 2109 09/09/11 0508 09/09/11 1030  BP: 107/71 122/70 131/72 126/63  Pulse: 87 97 92 94  Temp:  98.2 F (36.8 C) 98.3 F (36.8 C) 97.5 F (36.4 C)  TempSrc:  Oral Oral Oral  Resp:  22 24 20   Height:      Weight:      SpO2:  96% 96% 97%   Weight change:   Intake/Output Summary (Last 24 hours) at 09/09/11 1221 Last data filed at 09/09/11 0925  Gross per 24 hour  Intake    840 ml  Output    775 ml  Net     65 ml    General: Alert, awake, oriented person only, in no acute distress.  HEENT: [place]/AT PEERL, EOMI Neck: Trachea midline,  no masses, no thyromegal,y no JVD, no carotid bruit OROPHARYNX:  Moist, No exudate/ erythema/lesions.  Heart: Regular rate and rhythm, without murmurs, rubs, gallops, PMI non-displaced, no heaves or thrills on palpation.  Lungs: Clear to auscultation, no wheezing or rhonchi noted. No increased vocal fremitus resonant to percussion  Abdomen: Soft, nontender, nondistended, positive bowel sounds, no masses no hepatosplenomegaly noted..  Neuro: No focal neurological deficits noted cranial nerves II through XII grossly intact. DTRs 2+ bilaterally upper and lower extremities. Strength functional in bilateral upper and lower extremities. Musculoskeletal: No warm swelling or erythema around joints, no spinal tenderness noted.  Lab Results:  [facility] 09/09/11 0533 09/08/11 0424  NA 137 134*  K 3.8 3.7  CL 102 101  CO2 26 26  GLUCOSE 83 87  BUN 11 15  CREATININE 0.93 1.02  CALCIUM 8.6 8.4  MG -- 2.0  PHOS -- 2.3    Basename 09/08/11 0424  AST 16  ALT 15  ALKPHOS 79  BILITOT 0.7  PROT 5.7*  ALBUMIN 3.2*   No results found for this basename: LIPASE:2,AMYLASE:2 in the last 72 hours  Basename 09/09/11 0533 09/08/11 0424  WBC 6.7 7.0  NEUTROABS 5.7 --  HGB 11.9* 12.0*  HCT 33.9* 34.5*  MCV 93.6  93.8  PLT 161 178   No results found for this basename: CKTOTAL:3,CKMB:3,CKMBINDEX:3,TROPONINI:3 in the last 72 hours No components found with this basename: POCBNP:3 No results found for this basename: DDIMER:2 in the last 72 hours No results found for this basename: HGBA1C:2 in the last 72 hours No results found for this basename: CHOL:2,HDL:2,LDLCALC:2,TRIG:2,CHOLHDL:2,LDLDIRECT:2 in the last 72 hours  Basename 09/08/11 0424  TSH 0.506  T4TOTAL --  T3FREE --  THYROIDAB --   No results found for this basename: VITAMINB12:2,FOLATE:2,FERRITIN:2,TIBC:2,IRON:2,RETICCTPCT:2 in the last 72 hours  Micro Results: No results found for this or any previous visit (from the past 240 hour(s)).  Studies/Results: Ct Head Wo Contrast  09/07/2011  *RADIOLOGY REPORT*  Clinical Data: Altered mental status after a fall.  Abnormal gait. More confused.  CT HEAD WITHOUT CONTRAST  Technique:  Contiguous axial images were obtained from the base of the skull through the vertex without contrast.  Comparison: 03/20/2011  Findings: There is no acute intracranial hemorrhage, infarction, or mass lesion.  Diffuse atrophy with scattered white matter lucencies consistent with small vessel ischemic disease, unchanged.  No osseous abnormality.  IMPRESSION: No acute intracranial abnormalities.  Original Report Authenticated By: Gwynn Burly, M.D.    Medications: I have reviewed the patient's current medications. Scheduled Meds:    . sodium chloride  Intravenous STAT  . aspirin EC  81 mg Oral Daily  . buPROPion  300 mg Oral Daily  . cefTRIAXone (ROCEPHIN)  IV  1 g Intravenous Q24H  . enoxaparin  40 mg Subcutaneous Q24H  . feeding supplement  237 mL Oral QHS  . finasteride  5 mg Oral Daily  . losartan  100 mg Oral Daily  . mulitivitamin with minerals  1 tablet Oral Daily  . venlafaxine  225 mg Oral Daily  . vitamin E  400 Units Oral Daily   Continuous Infusions:  PRN Meds:.acetaminophen, acetaminophen,  ondansetron (ZOFRAN) IV, ondansetron, polyethylene glycol Assessment/Plan: Patient Active Hospital Problem List: Altered mental status (09/08/2011)   Assessment: This is resolved.    UTI (lower urinary tract infection) (09/08/2011)   Assessment: Urine culture pending. Pt presently on rocephin day # 3. Will add cipro as pt appears to have worsening clinical symptoms and this may reflect untreated UTI due to resistance.   Fall (09/08/2011)   Assessment: PT recommendations noted.    Leukocytosis (09/08/2011)   Assessment: Associated with urinary tract infection continue antibiotic   Dehydration (09/08/2011)   Assessment: Improved with IV hydration    Depression (09/08/2011)   Assessment: Patient has features of depression with paranoia. Psychiatry to see patient today.  Delirium   Assessment: Pt has an acute delirium today which I believe may be more psychiatric in nature rather than toxic. Will await psychiatric evaluation today.  LOS: 2 days

## 2011-09-10 NOTE — Progress Notes (Signed)
Pt is very anxious and confused this evening. Pt is having tremors and constantly trying to get OOB. MD on call notified and orders obtained for 1mg  IV ativan. Pt has received ativan but is still agitated at this time. Will continue to monitor pt.

## 2011-09-10 NOTE — Progress Notes (Signed)
Subjective: Patient's wife present. Pt is confused today and appears to have some acute delirium.  Objective: Filed Vitals:   09/09/11 1450 09/09/11 2110 09/10/11 0453 09/10/11 1315  BP: 137/77 122/64 113/66 141/87  Pulse: 93 105 99 101  Temp: 97.5 F (36.4 C) 97.5 F (36.4 C) 98.1 F (36.7 C) 97.8 F (36.6 C)  TempSrc: Oral Axillary Axillary Axillary  Resp: 20 20 19 18   Height:      Weight:      SpO2: 96% 91% 92% 93%   Weight change:   Intake/Output Summary (Last 24 hours) at 09/10/11 1729 Last data filed at 09/09/11 1749  Gross per 24 hour  Intake    120 ml  Output    300 ml  Net   -180 ml    General: Alert, awake, oriented person only, in no acute distress.  HEENT: [place]/AT PEERL, EOMI Neck: Trachea midline,  no masses, no thyromegal,y no JVD, no carotid bruit OROPHARYNX:  Moist, No exudate/ erythema/lesions.  Heart: Regular rate and rhythm, without murmurs, rubs, gallops, PMI non-displaced, no heaves or thrills on palpation.  Lungs: Clear to auscultation, no wheezing or rhonchi noted. No increased vocal fremitus resonant to percussion  Abdomen: Soft, nontender, nondistended, positive bowel sounds, no masses no hepatosplenomegaly noted..  Neuro: No focal neurological deficits noted cranial nerves II through XII grossly intact. DTRs 2+ bilaterally upper and lower extremities. Strength functional in bilateral upper and lower extremities. Musculoskeletal: No warm swelling or erythema around joints, no spinal tenderness noted.  Lab Results:  [facility] 09/09/11 0533 09/08/11 0424  NA 137 134*  K 3.8 3.7  CL 102 101  CO2 26 26  GLUCOSE 83 87  BUN 11 15  CREATININE 0.93 1.02  CALCIUM 8.6 8.4  MG -- 2.0  PHOS -- 2.3    Basename 09/08/11 0424  AST 16  ALT 15  ALKPHOS 79  BILITOT 0.7  PROT 5.7*  ALBUMIN 3.2*   No results found for this basename: LIPASE:2,AMYLASE:2 in the last 72 hours  Basename 09/09/11 0533 09/08/11 0424  WBC 6.7 7.0  NEUTROABS 5.7 --  HGB  11.9* 12.0*  HCT 33.9* 34.5*  MCV 93.6 93.8  PLT 161 178   No results found for this basename: CKTOTAL:3,CKMB:3,CKMBINDEX:3,TROPONINI:3 in the last 72 hours No components found with this basename: POCBNP:3 No results found for this basename: DDIMER:2 in the last 72 hours No results found for this basename: HGBA1C:2 in the last 72 hours No results found for this basename: CHOL:2,HDL:2,LDLCALC:2,TRIG:2,CHOLHDL:2,LDLDIRECT:2 in the last 72 hours  Basename 09/08/11 0424  TSH 0.506  T4TOTAL --  T3FREE --  THYROIDAB --   No results found for this basename: VITAMINB12:2,FOLATE:2,FERRITIN:2,TIBC:2,IRON:2,RETICCTPCT:2 in the last 72 hours  Micro Results: Recent Results (from the past 240 hour(s))  URINE CULTURE     Status: Normal   Collection Time   09/07/11 10:10 PM      Component Value Range Status Comment   Specimen Description URINE, RANDOM   Final    Special Requests Normal   Final    Culture  Setup Time [id number]   Final    Colony Count NO GROWTH   Final    Culture NO GROWTH   Final    Report Status 09/09/2011 FINAL   Final     Studies/Results: Ct Head Wo Contrast  09/07/2011  *RADIOLOGY REPORT*  Clinical Data: Altered mental status after a fall.  Abnormal gait. More confused.  CT HEAD WITHOUT CONTRAST  Technique:  Contiguous axial images  were obtained from the base of the skull through the vertex without contrast.  Comparison: 03/20/2011  Findings: There is no acute intracranial hemorrhage, infarction, or mass lesion.  Diffuse atrophy with scattered white matter lucencies consistent with small vessel ischemic disease, unchanged.  No osseous abnormality.  IMPRESSION: No acute intracranial abnormalities.  Original Report Authenticated By: Gwynn Burly, M.D.    Medications: I have reviewed the patient's current medications. Scheduled Meds:    . ARIPiprazole  2 mg Oral BID  . aspirin EC  81 mg Oral Daily  . buPROPion  150 mg Oral Daily  . enoxaparin  40 mg Subcutaneous Q24H   . feeding supplement  237 mL Oral QHS  . finasteride  5 mg Oral Daily  . losartan  100 mg Oral Daily  . mulitivitamin with minerals  1 tablet Oral Daily  . venlafaxine  225 mg Oral Daily  . vitamin E  400 Units Oral Daily  . DISCONTD: cefTRIAXone (ROCEPHIN)  IV  1 g Intravenous Q24H   Continuous Infusions:  PRN Meds:.acetaminophen, acetaminophen, LORazepam, ondansetron (ZOFRAN) IV, ondansetron, polyethylene glycol Assessment/Plan:  Patient Active Hospital Problem List: Altered mental status (09/08/2011)   Assessment: This is resolved.    UTI (lower urinary tract infection) (09/08/2011)   Assessment: Urine culture negative so will D/C antibiotics.   Fall (09/08/2011)   Assessment: PT recommendations noted.    Leukocytosis (09/08/2011)   Assessment: Resolved  Dehydration (09/08/2011)   Assessment: Improved with IV hydration    Depression (09/08/2011)   Assessment: Patient has features of depression with paranoia. Psychiatry started on Abilify yesterday.  Delirium   Assessment: Pt has an acute delirium today which I believe may be more psychiatric in nature rather than toxic. Will await psychiatric evaluation today.   LOS: 3 days

## 2011-09-10 NOTE — Progress Notes (Signed)
1500 to present pt resting quietly without c/o. Family at bedside. No changes in am assessment. Cont with plan of care

## 2011-09-11 ENCOUNTER — Inpatient Hospital Stay (HOSPITAL_COMMUNITY): Payer: Medicare Other

## 2011-09-11 ENCOUNTER — Encounter (HOSPITAL_COMMUNITY): Payer: Self-pay | Admitting: *Deleted

## 2011-09-11 MED ORDER — SODIUM CHLORIDE 0.9 % IV SOLN
INTRAVENOUS | Status: DC
  Administered 2011-09-11 – 2011-09-12 (×2): via INTRAVENOUS

## 2011-09-11 NOTE — Progress Notes (Signed)
Pt having decreased urine output. MD made aware during her assessment of pt this afternoon. Pt has had minimal po intake r/t his lethargy this am. MD aware of this too. Pt taking more po intake at this time. Will continue to monitor intake and output.  [name] [facility] 6:20 PM 09/11/2011

## 2011-09-11 NOTE — Progress Notes (Signed)
Physical Therapy Treatment Patient Details Name: Juan Diaz MRN: [id number] DOB: Apr 05, 1933 Today's Date: 09/11/2011  PT Assessment/Plan  PT - Assessment/Plan Comments on Treatment Session: Pt required Min encouragement for participation. Some difficulty following commands this session-required repeated verbal and tactile cues. Discussed D/C plan with wife who states she works during day. Feel ST rehab would be more appropriate for safety/fall risk.  PT Plan: Discharge plan needs to be updated Follow Up Recommendations: Skilled nursing facility;Supervision/Assistance - 24 hour Equipment Recommended: Defer to next venue PT Goals  Acute Rehab PT Goals PT Goal: Supine/Side to Sit - Progress: Progressing toward goal PT Goal: Sit to Supine/Side - Progress: Progressing toward goal PT Goal: Sit to Stand - Progress: Progressing toward goal PT Goal: Stand to Sit - Progress: Progressing toward goal PT Goal: Ambulate - Progress: Progressing toward goal PT Goal: Perform Home Exercise Program - Progress: Progressing toward goal  PT Treatment Precautions/Restrictions  Precautions Precautions: Fall Precaution Comments: Pt crosses legs and keeps feet very close together while sitting and standing Restrictions Weight Bearing Restrictions: No Mobility (including Balance) Bed Mobility Bed Mobility: Yes Supine to Sit Details (indicate cue type and reason): Min-guard asssist. VCs safety, technique, hand placement. Increased time.  Transfers Transfers: Yes Sit to Stand: 4: Min assist;From bed;From elevated surface;With upper extremity assist Sit to Stand Details (indicate cue type and reason): Multi-modal cues for safety, technique, hand placement. Assist to rise, stabilize.  Stand to Sit: 4: Min assist;To chair/3-in-1;With upper extremity assist;With armrests Stand to Sit Details: VCs safety, technique, hand placement.  Ambulation/Gait Ambulation/Gait: Yes Ambulation/Gait Assistance: 4: Min  assist Ambulation/Gait Assistance Details (indicate cue type and reason): VCs safety, posture, safe use of RW, for pt to increase step length. Assist to stabililze and maneuver RW. Narrow BOS Ambulation Distance (Feet): 85 Feet Assistive device: Rolling walker Gait Pattern: Shuffle;Decreased stride length;Decreased step length - left;Decreased step length - right;Decreased weight shift to left;Decreased weight shift to right  Posture/Postural Control Posture/Postural Control: No significant limitations Exercise  General Exercises - Lower Extremity Ankle Circles/Pumps: AROM;Both;10 reps;Seated Quad Sets: AAROM/AROM;Both;10 reps;Seated (AA to facilitate and encourage) Hip ABduction/ADduction: AAROM/AROM;Both;10 reps;Seated--noted stiffness and pt's tendency to keep LEs crossed. (AA to facilitate and encourage) Hip Flexion/Marching: AROM;Both;10 reps;Seated End of Session PT - End of Session Equipment Utilized During Treatment: Gait belt Activity Tolerance: Patient limited by fatigue Patient left: in chair;with call bell in reach General Behavior During Session: Lethargic Cognition: Impaired (difficulty following 1 step commands this session)  [name] Alert [facility] 09/11/2011, 2:26 PM [phone]

## 2011-09-11 NOTE — Progress Notes (Signed)
Subjective: Patient's wife present. Pt's delirium is much improved since yesterday. Patient still continues to ramble however he is oriented and coherent in his rambling. His wife states that that is his baseline.  Nurses noted to me that the patient's urine output is minimal. The patient had refused to take his medications this morning however has not agreed to take them orally.  Objective: Filed Vitals:   09/10/11 2053 09/11/11 0515 09/11/11 1438 09/11/11 2003  BP: 166/93 167/87 128/81 154/83  Pulse: 100 100 102 93  Temp: 97.9 F (36.6 C) 98.5 F (36.9 C) 97.9 F (36.6 C) 97.4 F (36.3 C)  TempSrc: Axillary Axillary Oral Oral  Resp: 32 22 20 18   Height:      Weight:      SpO2: 95% 94% 96% 96%   Weight change:   Intake/Output Summary (Last 24 hours) at 09/11/11 2027 Last data filed at 09/11/11 1440  Gross per 24 hour  Intake      0 ml  Output    525 ml  Net   -525 ml    General: Alert, awake, oriented x 3, in no acute distress. Patient sitting up in chair. HEENT: [place]/AT PEERL, EOMI Neck: Trachea midline,  no masses, no thyromegal,y no JVD, no carotid bruit OROPHARYNX:  Moist, No exudate/ erythema/lesions.  Heart: Regular rate and rhythm, without murmurs, rubs, gallops, PMI non-displaced, no heaves or thrills on palpation.  Lungs: Clear to auscultation, no wheezing or rhonchi noted. No increased vocal fremitus resonant to percussion  Abdomen: Soft, nontender, nondistended, positive bowel sounds, no masses no hepatosplenomegaly noted..  Neuro: No focal neurological deficits noted cranial nerves II through XII grossly intact. DTRs 2+ bilaterally upper and lower extremities. Strength functional in bilateral upper and lower extremities. Musculoskeletal: No warm swelling or erythema around joints, no spinal tenderness noted.  Lab Results:  [facility] 09/09/11 0533  NA 137  K 3.8  CL 102  CO2 26  GLUCOSE 83  BUN 11  CREATININE 0.93  CALCIUM 8.6  MG --  PHOS --   No  results found for this basename: AST:2,ALT:2,ALKPHOS:2,BILITOT:2,PROT:2,ALBUMIN:2 in the last 72 hours No results found for this basename: LIPASE:2,AMYLASE:2 in the last 72 hours  Basename 09/09/11 0533  WBC 6.7  NEUTROABS 5.7  HGB 11.9*  HCT 33.9*  MCV 93.6  PLT 161   No results found for this basename: CKTOTAL:3,CKMB:3,CKMBINDEX:3,TROPONINI:3 in the last 72 hours No components found with this basename: POCBNP:3 No results found for this basename: DDIMER:2 in the last 72 hours No results found for this basename: HGBA1C:2 in the last 72 hours No results found for this basename: CHOL:2,HDL:2,LDLCALC:2,TRIG:2,CHOLHDL:2,LDLDIRECT:2 in the last 72 hours No results found for this basename: TSH,T4TOTAL,FREET3,T3FREE,THYROIDAB in the last 72 hours No results found for this basename: VITAMINB12:2,FOLATE:2,FERRITIN:2,TIBC:2,IRON:2,RETICCTPCT:2 in the last 72 hours  Micro Results: Recent Results (from the past 240 hour(s))  URINE CULTURE     Status: Normal   Collection Time   09/07/11 10:10 PM      Component Value Range Status Comment   Specimen Description URINE, RANDOM   Final    Special Requests Normal   Final    Culture  Setup Time [id number]   Final    Colony Count NO GROWTH   Final    Culture NO GROWTH   Final    Report Status 09/09/2011 FINAL   Final     Studies/Results: Ct Head Wo Contrast  09/07/2011  *RADIOLOGY REPORT*  Clinical Data: Altered mental status after a fall.  Abnormal gait. More confused.  CT HEAD WITHOUT CONTRAST  Technique:  Contiguous axial images were obtained from the base of the skull through the vertex without contrast.  Comparison: 03/20/2011  Findings: There is no acute intracranial hemorrhage, infarction, or mass lesion.  Diffuse atrophy with scattered white matter lucencies consistent with small vessel ischemic disease, unchanged.  No osseous abnormality.  IMPRESSION: No acute intracranial abnormalities.  Original Report Authenticated By: Gwynn Burly,  M.D.    Medications: I have reviewed the patient's current medications. Scheduled Meds:    . ARIPiprazole  2 mg Oral BID  . aspirin EC  81 mg Oral Daily  . buPROPion  150 mg Oral Daily  . enoxaparin  40 mg Subcutaneous Q24H  . feeding supplement  237 mL Oral QHS  . finasteride  5 mg Oral Daily  . losartan  100 mg Oral Daily  . mulitivitamin with minerals  1 tablet Oral Daily  . venlafaxine  225 mg Oral Daily  . vitamin E  400 Units Oral Daily   Continuous Infusions:    . sodium chloride     PRN Meds:.acetaminophen, acetaminophen, LORazepam, ondansetron (ZOFRAN) IV, ondansetron, polyethylene glycol Assessment/Plan:  Patient Active Hospital Problem List: Altered mental status (09/08/2011)   Assessment: This is improved today. Based on the patient's symptomology yesterday there some question as to whether or not this could be side effects from Abilify. This was brought to the attention of psychiatrist who wants to continue with the Abilify for now and observe the patient's response to the medication over a period of at least 72 hours.    UTI (lower urinary tract infection) (09/08/2011)   Assessment: Urine culture negative so will D/C antibiotics.   Fall (09/08/2011)   Assessment: PT recommendations noted.    Leukocytosis (09/08/2011)   Assessment: Resolved  Dehydration (09/08/2011)   Assessment: Patient's urine output is again decreased due to his lack of oral intake I will go ahead and restart some IV fluids on the patient.    Depression (09/08/2011)   Assessment: Patient has features of depression with paranoia. Psychiatry recommended continuing Abilify.  Delirium   Assessment: Delirium has resolved today  LOS: 4 days

## 2011-09-11 NOTE — Progress Notes (Signed)
Met with Pt's wife and offered emotional support while Pt participated in PT.  Called several area psychiatrists on wife's behalf to learn who accepts [facility].  Dr. Raquel James with [facility] accepts [facility] and has openings starting next week.  Provided Pt's wife with Dr. Debarah Crape information.  Wife to schedule appt for Pt after Pt completes rehab at [facility].  Discussed d/c plan with wife, as PT recommended SNF.  Pt and wife agreeable to SNF and provided family with [place] SNF list.  Permission to fax Pt's info to [facility] obtained.    See [place] note in shadow chart for more details.   Clinical Social Work Dept to continue to follow.  Providence Crosby, LCSWA Clinical Social Work [phone]

## 2011-09-11 NOTE — Progress Notes (Signed)
Pt seems to be having side effects from Abilify including tremors, restlessness, lethargy. Spoke with hospitalist MD and she feels pt is having side effects as well. Spoke with Dr. Lolly Mustache regaurding Abilify and the possible side effects pt is exhibiting. Dr. Lolly Mustache stated that he wanted to keep pt on Abilify for at least another 24 hours before determining if med needs to be stopped or adjusted. Will give pt today's dose and will continue to monitor for side effects.   [name] [facility] 4:27 PM 09/11/2011

## 2011-09-11 NOTE — Consult Note (Signed)
  Juan Diaz is a 78 y.o. male [id number] [birth year]  Subjective/Objective: Patient seen today and spoke to his wife. As per wife patient is somewhat better from the past. He is less confused and less depressed. He is taking Abilify however patient reported no improvement with Abilify. He remembered taking Abilify in the past however wife denied that he ever took Abilify. Overall his affect is improved but he continues to be confused and his mood remains irritable. He denies any side effects. There are no shakes or tremors present. He is minimally cooperative but relevant in conversation.  Mental status examination Patient is anxious and easily irritable. He is confused and do not remember details. At times appeared paranoia denies any auditory or visual hallucination. He is psychomotor depressed with limited eye contact. He denies any active suicidal thoughts or homicidal thoughts. He described his mood as depressed and his affect is constricted. He's alert and oriented x3. His insight judgment and impulse control is fair.     Filed Vitals:   09/11/11 0515  BP: 167/87  Pulse: 100  Temp: 98.5 F (36.9 C)  Resp: 22    Lab Results:   BMET    Component Value Date/Time   NA 137 09/09/2011 0533   K 3.8 09/09/2011 0533   CL 102 09/09/2011 0533   CO2 26 09/09/2011 0533   GLUCOSE 83 09/09/2011 0533   BUN 11 09/09/2011 0533   CREATININE 0.93 09/09/2011 0533   CALCIUM 8.6 09/09/2011 0533   GFRNONAA 80* 09/09/2011 0533   GFRAA >90 09/09/2011 0533    Medications:  Scheduled:     . ARIPiprazole  2 mg Oral BID  . aspirin EC  81 mg Oral Daily  . buPROPion  150 mg Oral Daily  . enoxaparin  40 mg Subcutaneous Q24H  . feeding supplement  237 mL Oral QHS  . finasteride  5 mg Oral Daily  . losartan  100 mg Oral Daily  . mulitivitamin with minerals  1 tablet Oral Daily  . venlafaxine  225 mg Oral Daily  . 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  Plan Continue Abilify and his current antidepressant. Patient does not exhibit any side effects. Spoke to wife who endorse marginal improvement. Call consultation liaison service for follow up, if no improvement consider geriatric psychiatric hospitalization

## 2011-09-12 ENCOUNTER — Inpatient Hospital Stay (HOSPITAL_COMMUNITY): Payer: Medicare Other

## 2011-09-12 LAB — URINALYSIS, ROUTINE W REFLEX MICROSCOPIC
Bilirubin Urine: NEGATIVE
Hgb urine dipstick: NEGATIVE
Ketones, ur: NEGATIVE mg/dL
Nitrite: NEGATIVE
Specific Gravity, Urine: 1.025 (ref 1.005–1.030)
pH: 6 (ref 5.0–8.0)

## 2011-09-12 LAB — DIFFERENTIAL
Basophils Relative: 0 % (ref 0–1)
Eosinophils Absolute: 0.1 10*3/uL (ref 0.0–0.7)
Lymphs Abs: 0.3 10*3/uL — ABNORMAL LOW (ref 0.7–4.0)
Monocytes Absolute: 0.9 10*3/uL (ref 0.1–1.0)
Monocytes Relative: 7 % (ref 3–12)
Neutro Abs: 10.9 10*3/uL — ABNORMAL HIGH (ref 1.7–7.7)

## 2011-09-12 LAB — AMMONIA: Ammonia: 23 umol/L (ref 11–60)

## 2011-09-12 LAB — BASIC METABOLIC PANEL
BUN: 15 mg/dL (ref 6–23)
Chloride: 98 mEq/L (ref 96–112)
Creatinine, Ser: 0.77 mg/dL (ref 0.50–1.35)
GFR calc Af Amer: >90 mL/min (ref >90)
Glucose, Bld: 121 mg/dL — ABNORMAL HIGH (ref 70–99)

## 2011-09-12 LAB — CBC
HCT: 40.1 % (ref 39.0–52.0)
Hemoglobin: 14 g/dL (ref 13.0–17.0)
MCH: 32.4 pg (ref 26.0–34.0)
MCHC: 34.9 g/dL (ref 30.0–36.0)
RBC: 4.32 MIL/uL (ref 4.22–5.81)

## 2011-09-12 MED ORDER — LORAZEPAM 2 MG/ML IJ SOLN: 0.2500 mg | Freq: Three times a day (TID) | INTRAMUSCULAR | Status: DC | PRN

## 2011-09-12 MED ORDER — LORAZEPAM 2 MG/ML IJ SOLN: 1.0000 mg | Freq: Three times a day (TID) | INTRAMUSCULAR | Status: DC | PRN

## 2011-09-12 NOTE — Progress Notes (Signed)
Subjective: Patient continues to be somnolent for most of the morning. Nurses report that the patient was agitated last night. The patient's wife is at bedside and is very concerned about his mental status as am I. These changes all appear to have intensified with the initiation of Abilify. This certainly could be coincidental however at this time I have no other reasons to explain the patient's psychosis except for hospital due to psychosis. Objective: Filed Vitals:   09/11/11 1438 09/11/11 2003 09/12/11 0449 09/12/11 1359  BP: 128/81 154/83 160/86 112/70  Pulse: 102 93 96 97  Temp: 97.9 F (36.6 C) 97.4 F (36.3 C) 97.8 F (36.6 C) 98.3 F (36.8 C)  TempSrc: Oral Oral Axillary Oral  Resp: 20 18 19 18   Height:      Weight:      SpO2: 96% 96% 91% 94%   Weight change:   Intake/Output Summary (Last 24 hours) at 09/12/11 2101 Last data filed at 09/12/11 1700  Gross per 24 hour  Intake 1358.75 ml  Output    850 ml  Net 508.75 ml    General: Somnolent but arousable. He is not oriented to place or time but does answer to his name.  HEENT: [place]/AT PEERL, EOMI Neck: Trachea midline,  no masses, no thyromegal,y no JVD, no carotid bruit OROPHARYNX:  Moist, No exudate/ erythema/lesions.  Heart: Regular rate and rhythm, without murmurs, rubs, gallops, PMI non-displaced, no heaves or thrills on palpation.  Lungs: Clear to auscultation, no wheezing or rhonchi noted. No increased vocal fremitus resonant to percussion  Abdomen: Soft, nontender, nondistended, diminished but positive bowel sounds, no masses no hepatosplenomegaly noted..  Neuro: No focal neurological deficits noted cranial nerves II through XII grossly intact. DTRs 2+ bilaterally upper and lower extremities. The patient unable however to cooperate with examination Musculoskeletal: No warm swelling or erythema around joints, no spinal tenderness noted. Psychiatric: Patient active delirium.   Lab Results:  [facility] 09/12/11 0507    NA 137  K 3.7  CL 98  CO2 28  GLUCOSE 121*  BUN 15  CREATININE 0.77  CALCIUM 8.9  MG --  PHOS --   No results found for this basename: AST:2,ALT:2,ALKPHOS:2,BILITOT:2,PROT:2,ALBUMIN:2 in the last 72 hours No results found for this basename: LIPASE:2,AMYLASE:2 in the last 72 hours  Basename 09/12/11 0507  WBC 12.2*  NEUTROABS 10.9*  HGB 14.0  HCT 40.1  MCV 92.8  PLT 213   No results found for this basename: CKTOTAL:3,CKMB:3,CKMBINDEX:3,TROPONINI:3 in the last 72 hours No components found with this basename: POCBNP:3 No results found for this basename: DDIMER:2 in the last 72 hours No results found for this basename: HGBA1C:2 in the last 72 hours No results found for this basename: CHOL:2,HDL:2,LDLCALC:2,TRIG:2,CHOLHDL:2,LDLDIRECT:2 in the last 72 hours No results found for this basename: TSH,T4TOTAL,FREET3,T3FREE,THYROIDAB in the last 72 hours No results found for this basename: VITAMINB12:2,FOLATE:2,FERRITIN:2,TIBC:2,IRON:2,RETICCTPCT:2 in the last 72 hours  Micro Results: Recent Results (from the past 240 hour(s))  URINE CULTURE     Status: Normal   Collection Time   09/07/11 10:10 PM      Component Value Range Status Comment   Specimen Description URINE, RANDOM   Final    Special Requests Normal   Final    Culture  Setup Time [id number]   Final    Colony Count NO GROWTH   Final    Culture NO GROWTH   Final    Report Status 09/09/2011 FINAL   Final     Studies/Results: Dg Chest 2  View  09/11/2011  *RADIOLOGY REPORT*  Clinical Data: Tachypnea, confusion, hypertension.  CHEST - 2 VIEW  Comparison: 07/27/2010  Findings: New small left pleural effusion with adjacent atelectasis/consolidation in basilar segments of the left lower lobe.  There is a minimal linear scarring or subsegmental atelectasis at the right lung base.  Heart size normal.  Minimal spondylitic changes in the mid and lower thoracic spine.  IMPRESSION:  1.  New small left pleural effusion and adjacent  atelectasis/infiltrate.  Original Report Authenticated By: Osa Craver, M.D.   Ct Head Wo Contrast  09/07/2011  *RADIOLOGY REPORT*  Clinical Data: Altered mental status after a fall.  Abnormal gait. More confused.  CT HEAD WITHOUT CONTRAST  Technique:  Contiguous axial images were obtained from the base of the skull through the vertex without contrast.  Comparison: 03/20/2011  Findings: There is no acute intracranial hemorrhage, infarction, or mass lesion.  Diffuse atrophy with scattered white matter lucencies consistent with small vessel ischemic disease, unchanged.  No osseous abnormality.  IMPRESSION: No acute intracranial abnormalities.  Original Report Authenticated By: Gwynn Burly, M.D.    Medications: I have reviewed the patient's current medications. Scheduled Meds:   . ARIPiprazole  2 mg Oral BID  . aspirin EC  81 mg Oral Daily  . buPROPion  150 mg Oral Daily  . enoxaparin  40 mg Subcutaneous Q24H  . feeding supplement  237 mL Oral QHS  . finasteride  5 mg Oral Daily  . losartan  100 mg Oral Daily  . mulitivitamin with minerals  1 tablet Oral Daily  . venlafaxine  225 mg Oral Daily  . vitamin E  400 Units Oral Daily   Continuous Infusions:   . sodium chloride 75 mL/hr at 09/12/11 1314   PRN Meds:.acetaminophen, acetaminophen, LORazepam, ondansetron (ZOFRAN) IV, ondansetron, polyethylene glycol, DISCONTD: LORazepam, DISCONTD: LORazepam Assessment/Plan: Patient Active Hospital Problem List: Delirium (09/08/2011)   Assessment: Patient continues to be in a delirium in the last 72 hours. He does not have an obvious infectious etiology and no clinical signs of infection except for mildly elevated white blood cell count which is nonspecific in its nature. I repeated the urinalysis which is negative for signs of urinary tract infection, nd his electrolytes all appear to be within normal limits. I will check an ammonia on this patient. I will also check a KUB to evaluate for  constipation as the patient has not had a bowel movement in the last 48 hours and his clinical examination is somewhat unreliable due to his inability to cooperate    UTI (lower urinary tract infection) (09/08/2011)   Assessment: Urine culture was negative those antibiotics were discontinued. A repeat urinalysis is negative for elements consistent with urinary tract infection    Fall (09/08/2011)   Assessment: The patient had a fall without any obvious trauma. A CT of the head was performed which was negative. The patient shows no evidence of any musculoskeletal derangement as a result of this fall and has no complaints of pain.    Leukocytosis (09/08/2011)   Assessment: The patient's leukocytosis was initially resolved with IV fluids. However the patient's now has a mildly elevated white blood cell count 12.2. This is nonspecific I suspect is related to hemoconcentration. Her chest x-ray was done that showed no obvious evidence of pneumonia. Furthermore the patient has no other clinical elements that are consistent with an  Infection.    Dehydration (09/08/2011)   Assessment: She was mildly dehydrated on admission and  was treated with IV fluids. Due to the patient's delirium he has had very poor oral intake with IV fluids have been restarted. The patient's urine output should be monitored closely for adequate perfusion.     Major Depression (09/08/2011)  Assessment: Patient has a history of major depression and has been on several medications in the past. The patient was evaluated by psychiatry and started on Abilify. He is also presently on Wellbutrin and Effexor in addition to the Abilify. I have deferred his second troponin medications to psychiatry.   The patient also was on Ativan IV for his agitation. Please note that the patient's wife reports that he takes lorazepam 0.5-1 mg by mouth 3 times a day when necessary. However the feet 1 mg IV dose that was initially started in the hospital may have been  too high a dose for him. I have decreased the dose to 0.26 mg IV every 8 hours when necessary.  Generalized weakness   Assessment: Patient was seen in consultation by physical therapy and occupational therapy. Physical therapy recommended home health services at discharge. However given the patient's ongoing delirium he made referred for reevaluation by physical therapy close to the time of discharge to ensure that the patient's condition has not deteriorated to a need for short-term rehabilitation.   Patient not yet medically stable for discharge  LOS: 5 days

## 2011-09-12 NOTE — Progress Notes (Signed)
Pt has slept most of the day but is now wide awake and talking to himself.  Attempted to get OOB once, stating he "needs to take his toolbox to the living room to work on his car".  Gets agitated when staff tries to reorient him to being at the hospital, refuses food, drink or diversion.  Bed alarm on, will continue to monitor. Juan Diaz

## 2011-09-13 ENCOUNTER — Inpatient Hospital Stay (HOSPITAL_COMMUNITY): Payer: Medicare Other

## 2011-09-13 LAB — DIFFERENTIAL
Basophils Absolute: 0 10*3/uL (ref 0.0–0.1)
Basophils Relative: 0 % (ref 0–1)
Eosinophils Absolute: 0.1 10*3/uL (ref 0.0–0.7)
Monocytes Relative: 8 % (ref 3–12)
Neutro Abs: 7.2 10*3/uL (ref 1.7–7.7)
Neutrophils Relative %: 84 % — ABNORMAL HIGH (ref 43–77)

## 2011-09-13 LAB — CBC
Hemoglobin: 12.8 g/dL — ABNORMAL LOW (ref 13.0–17.0)
MCH: 32.2 pg (ref 26.0–34.0)
MCHC: 34.4 g/dL (ref 30.0–36.0)
Platelets: 234 10*3/uL (ref 150–400)
RDW: 11.8 % (ref 11.5–15.5)

## 2011-09-13 LAB — BASIC METABOLIC PANEL
BUN: 13 mg/dL (ref 6–23)
Calcium: 8.9 mg/dL (ref 8.4–10.5)
Chloride: 104 mEq/L (ref 96–112)
Creatinine, Ser: 0.8 mg/dL (ref 0.50–1.35)
GFR calc Af Amer: >90 mL/min (ref >90)
GFR calc non Af Amer: 85 mL/min — ABNORMAL LOW (ref >90)

## 2011-09-13 LAB — URINE CULTURE
Colony Count: NO GROWTH
Culture  Setup Time: 201303130323
Culture: NO GROWTH
Special Requests: NORMAL

## 2011-09-13 LAB — MAGNESIUM: Magnesium: 2.1 mg/dL (ref 1.5–2.5)

## 2011-09-13 MED ORDER — PRO-STAT SUGAR FREE PO LIQD
30.0000 mL | Freq: Every day | ORAL | Status: DC
  Administered 2011-09-13 – 2011-09-15 (×3): 30 mL via ORAL
  Filled 2011-09-13 (×3): qty 30

## 2011-09-13 MED ORDER — GADOBENATE DIMEGLUMINE 529 MG/ML IV SOLN
13.0000 mL | Freq: Once | INTRAVENOUS | Status: AC | PRN
  Administered 2011-09-13: 13 mL via INTRAVENOUS

## 2011-09-13 NOTE — Progress Notes (Signed)
Pt anxious and upset about not being able to find his glasses. Pt states he may have left them in MRI. Called MRI to check. Spoke with spouse Delbarton Lions, she states that she has his glasses at home and his watch. She spoke with pt to reassure him. Will continue to monitor.

## 2011-09-13 NOTE — Progress Notes (Signed)
Physical Therapy Treatment Patient Details Name: Juan Diaz MRN: [id number] DOB: October 31, 1932 Today's Date: 09/13/2011  PT Assessment/Plan  PT - Assessment/Plan Comments on Treatment Session: Continue to recommend SNF for rehab. Progressing slowly with mobility.  PT Plan: Discharge plan remains appropriate Follow Up Recommendations: Skilled nursing facility Equipment Recommended: Defer to next venue PT Goals  Acute Rehab PT Goals PT Goal: Supine/Side to Sit - Progress: Progressing toward goal PT Goal: Sit to Stand - Progress: Progressing toward goal PT Goal: Stand to Sit - Progress: Progressing toward goal PT Goal: Ambulate - Progress: Progressing toward goal PT Goal: Perform Home Exercise Program - Progress: Progressing toward goal  PT Treatment Precautions/Restrictions  Precautions Precautions: Fall Precaution Comments: Pt crosses legs and keeps feet very close together while sitting and standing Restrictions Weight Bearing Restrictions: No Mobility (including Balance) Bed Mobility Bed Mobility: Yes Supine to Sit: 4: Min assist;HOB elevated (Comment degrees) Supine to Sit Details (indicate cue type and reason): VCs safety, technique. Pt requested assist today. Assist for trunk to full upright and to encourage pt to participate.  Transfers Transfers: Yes Sit to Stand: 4: Min assist;From bed;From elevated surface;With upper extremity assist Sit to Stand Details (indicate cue type and reason): Multi-modal cues for safety, technique, hand placement. Assist to rise, stabilize.  Stand to Sit: 4: Min assist;With upper extremity assist;With armrests;To chair/3-in-1 Stand to Sit Details: Multi-modal cues for safety, technique, hand placement. Assist to control descent.  Ambulation/Gait Ambulation/Gait: Yes Ambulation/Gait Assistance Details (indicate cue type and reason): Assist for stability and negotiation with RW. VCs safety, posture, safe use of RW. Continues to demonstrate  narrow BOS but step length improved bilaterally this session Ambulation Distance (Feet): 100 Feet Assistive device: Rolling walker Gait Pattern: Step-through pattern;Trunk flexed;Decreased step length - left;Decreased step length - right;Decreased stride length    Exercise  General Exercises - Lower Extremity Ankle Circles/Pumps: AROM;AAROM;15 reps;Seated Long Arc Quad: AROM;Both;20 reps;Seated Hip ABduction/ADduction: AROM;Both;20 reps;Seated Hip Flexion/Marching: AROM;Seated (20 reps) End of Session PT - End of Session Equipment Utilized During Treatment: Gait belt Activity Tolerance: Patient tolerated treatment well;Patient limited by fatigue Patient left: in chair;with call bell in reach;with family/visitor present General Behavior During Session: [name] for tasks performed Cognition: Impaired continues to require repeated cues(verbal, tactile) for participation and safety  [name] Alert [facility] 09/13/2011, 11:35 AM [phone]

## 2011-09-13 NOTE — Progress Notes (Signed)
Juan Diaz is a 78 y.o. male patient admitted with confusion, recurrent falls. Thought to have UTI. Continues to have confusion, though more calm today. Appreciate psych input.  SUBJECTIVE OK, somnolent.   1. Urinary tract infection   2. Altered mental status   3. Fall   4. Agitation     Past Medical History  Diagnosis Date  . Hypertension   . Urinary retention    Current Facility-Administered Medications  Medication Dose Route Frequency Provider Last Rate Last Dose  . acetaminophen (TYLENOL) tablet 650 mg  650 mg Oral Q6H PRN Kathlen Mody, MD   650 mg at 09/08/11 0336   Or  . acetaminophen (TYLENOL) suppository 650 mg  650 mg Rectal Q6H PRN Kathlen Mody, MD   650 mg at 09/11/11 0817  . ARIPiprazole (ABILIFY) tablet 2 mg  2 mg Oral BID Nehemiah Settle, MD   2 mg at 09/13/11 0902  . aspirin EC tablet 81 mg  81 mg Oral Daily Kathlen Mody, MD   81 mg at 09/13/11 0903  . buPROPion (WELLBUTRIN XL) 24 hr tablet 150 mg  150 mg Oral Daily Nehemiah Settle, MD   150 mg at 09/13/11 0903  . enoxaparin (LOVENOX) injection 40 mg  40 mg Subcutaneous Q24H Kathlen Mody, MD   40 mg at 09/13/11 0905  . feeding supplement (ENSURE CLINICAL STRENGTH) liquid 237 mL  237 mL Oral QHS Ashley Jacobs, RD   237 mL at 09/12/11 2020  . finasteride (PROSCAR) tablet 5 mg  5 mg Oral Daily Kathlen Mody, MD   5 mg at 09/13/11 0903  . losartan (COZAAR) tablet 100 mg  100 mg Oral Daily Kathlen Mody, MD   100 mg at 09/13/11 0903  . mulitivitamin with minerals tablet 1 tablet  1 tablet Oral Daily Kathlen Mody, MD   1 tablet at 09/13/11 0903  . ondansetron (ZOFRAN) tablet 4 mg  4 mg Oral Q6H PRN Kathlen Mody, MD       Or  . ondansetron (ZOFRAN) injection 4 mg  4 mg Intravenous Q6H PRN Kathlen Mody, MD      . polyethylene glycol (MIRALAX / GLYCOLAX) packet 17 g  17 g Oral Daily PRN Kathlen Mody, MD      . venlafaxine (EFFEXOR-XR) 24 hr capsule 225 mg  225 mg Oral Daily Kathlen Mody, MD   225  mg at 09/13/11 0903  . vitamin E capsule 400 Units  400 Units Oral Daily Kathlen Mody, MD   400 Units at 09/13/11 0903  . DISCONTD: 0.9 %  sodium chloride infusion   Intravenous Continuous Altha Harm, MD 75 mL/hr at 09/12/11 2300    . DISCONTD: LORazepam (ATIVAN) injection 0.26 mg  0.26 mg Intravenous Q8H PRN Altha Harm, MD      . DISCONTD: LORazepam (ATIVAN) injection 1 mg  1 mg Intravenous Q6H PRN Jinger Neighbors, NP   1 mg at 09/11/11 2312  . DISCONTD: LORazepam (ATIVAN) injection 1 mg  1 mg Intravenous Q8H PRN Altha Harm, MD       No Known Allergies Principal Problem:  *Altered mental status Active Problems:  UTI (lower urinary tract infection)  Fall  Leukocytosis  Dehydration   Vital signs in last 24 hours: Temp:  [98 F (36.7 C)-98.7 F (37.1 C)] 98 F (36.7 C) (03/13 1431) Pulse Rate:  [94-99] 94  (03/13 1431) Resp:  [18-22] 20  (03/13 1431) BP: (142-157)/(78-84) 142/84 mmHg (03/13 1431) SpO2:  [94 %-95 %]  94 % (03/13 1431) Weight change:  Last BM Date: 09/12/11  Intake/Output from previous day: 03/12 0701 - 03/13 0700 In: 1295 [P.O.:20; I.V.:1275] Out: 775 [Urine:775] Intake/Output this shift: Total I/O In: 240 [P.O.:240] Out: 300 [Urine:300]  Lab Results:  [facility] 09/13/11 0525 09/12/11 0507  WBC 8.6 12.2*  HGB 12.8* 14.0  HCT 37.2* 40.1  PLT 234 213   BMET  Basename 09/13/11 0525 09/12/11 0507  NA 141 137  K 3.7 3.7  CL 104 98  CO2 28 28  GLUCOSE 91 121*  BUN 13 15  CREATININE 0.80 0.77  CALCIUM 8.9 8.9    Studies/Results: Dg Abd 1 View  09/12/2011  *RADIOLOGY REPORT*  Clinical Data: Constipation  ABDOMEN - 1 VIEW  Comparison: 12/16/2010  Findings: Generalized bowel distention. Mild stool in the ascending and transverse colon.  No obvious free intraperitoneal gas.  IMPRESSION: Nonobstructive bowel gas pattern. Mild stool.  Original Report Authenticated By: Donavan Burnet, M.D.    Medications: I have reviewed the  patient's current medications.   Physical exam GENERAL- alert HEAD- normal atraumatic, no neck masses, normal thyroid, no jvd RESPIRATORY- appears well, vitals normal, no respiratory distress, acyanotic, normal RR, ear and throat exam is normal, neck free of mass or lymphadenopathy, chest clear, no wheezing, crepitations, rhonchi, normal symmetric air entry CVS- regular rate and rhythm, S1, S2 normal, no murmur, click, rub or gallop ABDOMEN- abdomen is soft without significant tenderness, masses, organomegaly or guarding NEURO- pleasantly confused. EXTREMITIES- extremities normal, atraumatic, no cyanosis or edema  Plan   *Altered mental status- ? Cause. ? contusion versus other causes. MRI brain/eeg. Neurology consult depending on studies and progress. Hold ativan- hypersomnolence.  * UTI (lower urinary tract infection)/ Leukocytosis  Dehydration- follow repeat urine culture. * discussed plan of care with wife.    [name] 09/13/2011 4:27 PM Pager: [phone].

## 2011-09-13 NOTE — Progress Notes (Signed)
Nutrition Follow-up  Diet Order:  Low Sodium  Pt intake has improved to 50% of meals today.  Pt with some AMS.  Somnolence (3/10-3/12) lead to decreased intake.  PO 0-5% of meals during that time. Pt has been getting Ensure Clinical Strength nightly to supplement intake.  Pt is on Wellbutrin, Effexor, and Abilify.  Wellbutrin and effexor can decrease appetite and intake which is consistent with pt's wt loss and decreased intake since initial depression dx.  Abilify can stimulate intake and weight gain in children and adolescents, however unsure how this drug affects geriatric patients.  Pt is receiving MVI/MM.  PT recommends SNF.  Meds: Scheduled Meds:   . ARIPiprazole  2 mg Oral BID  . aspirin EC  81 mg Oral Daily  . buPROPion  150 mg Oral Daily  . enoxaparin  40 mg Subcutaneous Q24H  . feeding supplement  237 mL Oral QHS  . finasteride  5 mg Oral Daily  . losartan  100 mg Oral Daily  . mulitivitamin with minerals  1 tablet Oral Daily  . venlafaxine  225 mg Oral Daily  . vitamin E  400 Units Oral Daily   Continuous Infusions:   . sodium chloride 75 mL/hr at 09/12/11 2300   PRN Meds:.acetaminophen, acetaminophen, LORazepam, ondansetron (ZOFRAN) IV, ondansetron, polyethylene glycol, DISCONTD: LORazepam, DISCONTD: LORazepam  Labs:  CMP     Component Value Date/Time   NA 141 09/13/2011 0525   K 3.7 09/13/2011 0525   CL 104 09/13/2011 0525   CO2 28 09/13/2011 0525   GLUCOSE 91 09/13/2011 0525   BUN 13 09/13/2011 0525   CREATININE 0.80 09/13/2011 0525   CALCIUM 8.9 09/13/2011 0525   PROT 5.7* 09/08/2011 0424   ALBUMIN 3.2* 09/08/2011 0424   AST 16 09/08/2011 0424   ALT 15 09/08/2011 0424   ALKPHOS 79 09/08/2011 0424   BILITOT 0.7 09/08/2011 0424   GFRNONAA 85* 09/13/2011 0525   GFRAA >90 09/13/2011 0525     Intake/Output Summary (Last 24 hours) at 09/13/11 1607 Last data filed at 09/13/11 1433  Gross per 24 hour  Intake   1515 ml  Output    750 ml  Net    765 ml    Weight Status:   No new wt Last known wt: 141 lbs (3/8)  Nutrition Dx:  Inadequate oral intake, ongoing  Intervention:   1.  General healthful diet; encourage intake when able.  Somnolence seems improved today.  Was able to get OOB with PT. 2.  Supplements; Prostat once daily with am meds.  Mix with juice or soda  Monitor:   1.  Food/Beverage; pt to consume >50% of meals, Met today, continue.  Pt to consistently consume >50% with improved MS.  Pt to continue to consume Ensure and increase protein intake with Prostat   Hoyt Koch Pager #:  [phone]

## 2011-09-14 ENCOUNTER — Inpatient Hospital Stay (HOSPITAL_COMMUNITY)
Admit: 2011-09-14 | Discharge: 2011-09-14 | Disposition: A | Discharge status: Home / Self Care | Payer: Medicare Other | Attending: Internal Medicine | Admitting: Internal Medicine

## 2011-09-14 LAB — CBC
Hemoglobin: 12.3 g/dL — ABNORMAL LOW (ref 13.0–17.0)
MCH: 32.4 pg (ref 26.0–34.0)
MCHC: 34.5 g/dL (ref 30.0–36.0)
Platelets: 202 10*3/uL (ref 150–400)
RBC: 3.8 MIL/uL — ABNORMAL LOW (ref 4.22–5.81)

## 2011-09-14 LAB — COMPREHENSIVE METABOLIC PANEL
ALT: 23 U/L (ref 0–53)
AST: 23 U/L (ref 0–37)
Alkaline Phosphatase: 73 U/L (ref 39–117)
CO2: 29 mEq/L (ref 19–32)
Calcium: 8.8 mg/dL (ref 8.4–10.5)
Potassium: 3.2 mEq/L — ABNORMAL LOW (ref 3.5–5.1)
Sodium: 142 mEq/L (ref 135–145)
Total Protein: 5.8 g/dL — ABNORMAL LOW (ref 6.0–8.3)

## 2011-09-14 MED ORDER — POTASSIUM CHLORIDE 20 MEQ/15ML (10%) PO LIQD
40.0000 meq | Freq: Once | ORAL | Status: AC
  Administered 2011-09-14: 40 meq via ORAL
  Filled 2011-09-14: qty 30

## 2011-09-14 MED ORDER — POLYETHYLENE GLYCOL 3350 17 G PO PACK
17.0000 g | PACK | Freq: Every day | ORAL | Status: DC
  Administered 2011-09-14 – 2011-09-15 (×2): 17 g via ORAL
  Filled 2011-09-14 (×2): qty 1

## 2011-09-14 NOTE — Progress Notes (Signed)
SUBJECTIVE Better, less confused today, per wife. C/o constipation and poor appetite.   1. Urinary tract infection   2. Altered mental status   3. Fall   4. Agitation     Past Medical History  Diagnosis Date  . Hypertension   . Urinary retention    Current Facility-Administered Medications  Medication Dose Route Frequency Provider Last Rate Last Dose  . acetaminophen (TYLENOL) tablet 650 mg  650 mg Oral Q6H PRN Kathlen Mody, MD   650 mg at 09/08/11 0336   Or  . acetaminophen (TYLENOL) suppository 650 mg  650 mg Rectal Q6H PRN Kathlen Mody, MD   650 mg at 09/11/11 0817  . ARIPiprazole (ABILIFY) tablet 2 mg  2 mg Oral BID Nehemiah Settle, MD   2 mg at 09/14/11 1610  . aspirin EC tablet 81 mg  81 mg Oral Daily Kathlen Mody, MD   81 mg at 09/14/11 1107  . buPROPion (WELLBUTRIN XL) 24 hr tablet 150 mg  150 mg Oral Daily Nehemiah Settle, MD   150 mg at 09/14/11 1109  . enoxaparin (LOVENOX) injection 40 mg  40 mg Subcutaneous Q24H Kathlen Mody, MD   40 mg at 09/14/11 0833  . feeding supplement (ENSURE CLINICAL STRENGTH) liquid 237 mL  237 mL Oral QHS Ashley Jacobs, RD   237 mL at 09/13/11 2000  . feeding supplement (PRO-STAT SUGAR FREE 64) liquid 30 mL  30 mL Oral Daily Ashley Jacobs, RD   30 mL at 09/14/11 1105  . finasteride (PROSCAR) tablet 5 mg  5 mg Oral Daily Kathlen Mody, MD   5 mg at 09/14/11 1111  . gadobenate dimeglumine (MULTIHANCE) injection 13 mL  13 mL Intravenous Once PRN Medication Radiologist, MD   13 mL at 09/13/11 2141  . losartan (COZAAR) tablet 100 mg  100 mg Oral Daily Kathlen Mody, MD   100 mg at 09/14/11 1110  . mulitivitamin with minerals tablet 1 tablet  1 tablet Oral Daily Kathlen Mody, MD   1 tablet at 09/14/11 1112  . ondansetron (ZOFRAN) tablet 4 mg  4 mg Oral Q6H PRN Kathlen Mody, MD       Or  . ondansetron (ZOFRAN) injection 4 mg  4 mg Intravenous Q6H PRN Kathlen Mody, MD      . polyethylene glycol (MIRALAX / GLYCOLAX) packet 17 g   17 g Oral Daily PRN Kathlen Mody, MD   17 g at 09/13/11 2223  . polyethylene glycol (MIRALAX / GLYCOLAX) packet 17 g  17 g Oral Daily Hitesh Fouche, MD      . potassium chloride 20 MEQ/15ML (10%) liquid 40 mEq  40 mEq Oral Once Tanicka Bisaillon, MD   40 mEq at 09/14/11 1114  . venlafaxine (EFFEXOR-XR) 24 hr capsule 225 mg  225 mg Oral Daily Kathlen Mody, MD   225 mg at 09/14/11 1109  . vitamin E capsule 400 Units  400 Units Oral Daily Kathlen Mody, MD   400 Units at 09/14/11 1113   No Known Allergies Principal Problem:  *Altered mental status Active Problems:  UTI (lower urinary tract infection)  Fall  Leukocytosis  Dehydration   Vital signs in last 24 hours: Temp:  [97.9 F (36.6 C)-98.5 F (36.9 C)] 97.9 F (36.6 C) (03/14 1344) Pulse Rate:  [86-98] 95  (03/14 1344) Resp:  [18-20] 18  (03/14 1344) BP: (125-156)/(70-84) 134/84 mmHg (03/14 1344) SpO2:  [95 %-97 %] 95 % (03/14 1344) Weight change:  Last BM Date: 09/12/11  Intake/Output from previous day: 03/13 0701 - 03/14 0700 In: 420 [P.O.:420] Out: 700 [Urine:700] Intake/Output this shift:    Lab Results:  Basename 09/14/11 0545 09/13/11 0525  WBC 6.3 8.6  HGB 12.3* 12.8*  HCT 35.7* 37.2*  PLT 202 234   BMET  Basename 09/14/11 0545 09/13/11 0525  NA 142 141  K 3.2* 3.7  CL 106 104  CO2 29 28  GLUCOSE 86 91  BUN 13 13  CREATININE 0.79 0.80  CALCIUM 8.8 8.9    Studies/Results: Dg Abd 1 View  09/12/2011  *RADIOLOGY REPORT*  Clinical Data: Constipation  ABDOMEN - 1 VIEW  Comparison: 12/16/2010  Findings: Generalized bowel distention. Mild stool in the ascending and transverse colon.  No obvious free intraperitoneal gas.  IMPRESSION: Nonobstructive bowel gas pattern. Mild stool.  Original Report Authenticated By: Donavan Burnet, M.D.    Medications: I have reviewed the patient's current medications.   Physical exam GENERAL- alert HEAD- normal atraumatic, no neck masses, normal thyroid, no jvd RESPIRATORY-  appears well, vitals normal, no respiratory distress, acyanotic, normal RR, ear and throat exam is normal, neck free of mass or lymphadenopathy, chest clear, no wheezing, crepitations, rhonchi, normal symmetric air entry CVS- regular rate and rhythm, S1, S2 normal, no murmur, click, rub or gallop ABDOMEN- abdomen is soft without significant tenderness, masses, organomegaly or guarding NEURO- calm. EXTREMITIES- extremities normal, atraumatic, no cyanosis or edema  Plan  *Altered mental status- ? Cause. ? contusion versus other causes. MRI brain/eeg results pending. Seems to have responded to coming off ativan. Follow imaging studies.  * UTI (lower urinary tract infection)/ Leukocytosis  Dehydration- repeat urine culture negative.  * discussed plan of care with wife.        Onetha Gaffey 09/14/2011 7:32 PM Pager: [phone].

## 2011-09-14 NOTE — Progress Notes (Signed)
EEG completed, portable. 

## 2011-09-15 LAB — CREATININE, SERUM: GFR calc Af Amer: >90 mL/min (ref >90)

## 2011-09-15 MED ORDER — ENSURE CLINICAL ST REVIGOR PO LIQD: 237.0000 mL | Freq: Every day | ORAL | Status: AC

## 2011-09-15 MED ORDER — ARIPIPRAZOLE 2 MG PO TABS: 2.0000 mg | ORAL_TABLET | Freq: Two times a day (BID) | ORAL | Status: AC

## 2011-09-15 MED ORDER — POLYETHYLENE GLYCOL 3350 17 G PO PACK: 17.0000 g | PACK | Freq: Every day | ORAL | Status: AC | PRN

## 2011-09-15 NOTE — Progress Notes (Addendum)
Patient cleared for discharge. Met with patient and patients spouse, requesting [facility]. [facility] coming to do paperwork. CSW submitted request for auth on provider link. Notified cedric at [employer] of same.  Maricarmen Braziel C. Traci Gafford MSW, LCSW [phone] [facility] medicare authed stay at [facility]. Per cedric no ambulance auth needed.

## 2011-09-15 NOTE — Discharge Summary (Signed)
DISCHARGE SUMMARY  Juan Diaz  Juan#: [id number]  DOB:[birth year]  Date of Admission: 09/07/2011 Date of Discharge: 09/15/2011  Attending Physician:Haddy Mullinax  Patient's PCP:No primary provider on file.  Consults:Treatment Team:  Nehemiah Settle, MD  Discharge Diagnoses: Present on Admission:  .Altered mental status .UTI (lower urinary tract infection) .Fall .Leukocytosis .Dehydration    Medication List  As of 09/15/2011 10:57 AM   TAKE these medications         ARIPiprazole 2 MG tablet   Commonly known as: ABILIFY   Take 1 tablet (2 mg total) by mouth 2 (two) times daily.      aspirin 81 MG tablet   Take 81 mg by mouth daily.      buPROPion 300 MG 24 hr tablet   Commonly known as: WELLBUTRIN XL   Take 300 mg by mouth daily.      feeding supplement Liqd   Take 237 mLs by mouth at bedtime.      finasteride 5 MG tablet   Commonly known as: PROSCAR   Take 5 mg by mouth daily.      losartan 100 MG tablet   Commonly known as: COZAAR   Take 100 mg by mouth daily.      multivitamins ther. w/minerals Tabs   Take 1 tablet by mouth daily.      polyethylene glycol packet   Commonly known as: MIRALAX / GLYCOLAX   Take 17 g by mouth daily as needed.      venlafaxine 75 MG 24 hr capsule   Commonly known as: EFFEXOR-XR   Take 225 mg by mouth daily.      vitamin E 400 UNIT capsule   Generic drug: vitamin E   Take 400 Units by mouth daily.             Hospital Course: Juan Diaz was admitted on 09/08/11 after a fall followed by increasing confusion, and poor appetite. CT brain/MRI brain have not shown any acute intracranial event. He was placed on antibiotics for suspected UTI although urine culture was sterile. He had leukocytosis of unclear etiology. Psychiatry graciously saw patient and adjusted his meds. Juan Diaz seemed to respond to removal of ativan, which was causing him to be easily sedated. An  EEG was done but report was pending at  d/c. At this point, it appears like Juan Diaz had confusion related to multiple factors, including meds/infection/fall/dehydration. His sensorium is now at baseline and he has been referred to SNF for rehab. I discussed plan of care with his wife, who is very supportive.   Day of Discharge BP 136/75  Pulse 87  Temp(Src) 98.2 F (36.8 C) (Oral)  Resp 18  Ht 6' (1.829 m)  Wt 64.4 kg (141 lb 15.6 oz)  BMI 19.26 kg/m2  SpO2 97%  Physical Exam: Cam, not in distress. Lungs clear. S1S2 heard. No murmurs. Pulse regular. Neurologically Intact, although mentation seems rather slow.  Results for orders placed during the hospital encounter of 09/07/11 (from the past 24 hour(s))  CREATININE, SERUM     Status: Abnormal   Collection Time   09/15/11  5:01 AM      Component Value Range   Creatinine, Ser 0.79  0.50 - 1.35 (mg/dL)   GFR calc non Af Amer 85 (*) >90 (mL/min)   GFR calc Af Amer >90  >90 (mL/min)    Disposition: SNF.   Follow-up Appts: Discharge Orders    Future Orders Please Complete By Expires   Diet -  low sodium heart healthy      Increase activity slowly           Tests Needing Follow-up: EEG  Time spent in discharge (includes decision making & examination of pt): 40 minutes  Signed: Charlane Westry 09/15/2011, 10:57 AM

## 2011-09-15 NOTE — Progress Notes (Signed)
Patient's sensorium has cleared. MRI brain show no acute intracranial findings. Patient can d/c once disposition sorted out- ? Going to SNF. Discussed with his wife at bed side. Athleen Feltner,MD [phone]

## 2011-09-15 NOTE — Progress Notes (Signed)
Physical Therapy Treatment Patient Details Name: Yazan Gatling MRN: [id number] DOB: [birth year] Today's Date: 09/15/2011  Falls/AMS/UTI 10:30 - 11:00 2 gt  PT Assessment/Plan  PT - Assessment/Plan Comments on Treatment Session: Spouse present during session and pleased to see pt walking.  Pt plans to D/C to SNF for ST Rehab.  Cognition much improved, pleasant and cooperative. PT Plan: Discharge plan remains appropriate Follow Up Recommendations: Skilled nursing facility Equipment Recommended: Defer to next venue PT Goals  Acute Rehab PT Goals PT Goal Formulation: With patient Pt will go Supine/Side to Sit: with supervision PT Goal: Supine/Side to Sit - Progress: Progressing toward goal Pt will go Sit to Supine/Side: with supervision PT Goal: Sit to Supine/Side - Progress: Progressing toward goal Pt will go Sit to Stand: with supervision PT Goal: Sit to Stand - Progress: Progressing toward goal Pt will go Stand to Sit: with supervision PT Goal: Stand to Sit - Progress: Progressing toward goal Pt will Ambulate: 51 - 150 feet;with supervision;with least restrictive assistive device PT Goal: Ambulate - Progress: Progressing toward goal  PT Treatment Precautions/Restrictions  Precautions Precautions: Fall Precaution Comments: Pt crosses legs and keeps feet very close together while sitting and standing Restrictions Weight Bearing Restrictions: No Mobility (including Balance) Bed Mobility Bed Mobility: Yes (assisted pt OOB) Supine to Sit: 4: Min assist;HOB elevated (Comment degrees) Supine to Sit Details (indicate cue type and reason): increased time and HOB elevated 45' Transfers Transfers: Yes Sit to Stand: 3: Mod assist;From bed Sit to Stand Details (indicate cue type and reason): 75% VC's on hand placement to push up from bwed vs pull up on RW, plus severe posterior lean with LOB which therapist recovered. Stand to Sit: 4: Min assist;To chair/3-in-1 Stand to Sit Details:  75% VC's on hand placement to reach back prior to sit for increased safety and controlled decend. Ambulation/Gait Ambulation/Gait: Yes Ambulation/Gait Assistance: 4: Min assist Ambulation/Gait Assistance Details (indicate cue type and reason): 50% VC's to increase posture and maintain proper walker to self distance.  Pt demon narrow BOS and short step length.  Pt demon poor self corrective reaction to balance instability.  HIGH FALL RISK. Ambulation Distance (Feet): 150 Feet (75" x 2 w/ 1 sitting rest break) Assistive device: Rolling walker Gait Pattern: Step-through pattern;Decreased stride length;Trunk flexed;Shuffle Gait velocity: No c/o pain or dizzyness. Max c/o weakness and limited activity tolerance requiring rest breaks. Stairs: No Wheelchair Mobility Wheelchair Mobility: No  Posture/Postural Control Posture/Postural Control: No significant limitations Balance Balance Assessed: No (unsteady with poor self corrective response) Exercise    End of Session PT - End of Session Equipment Utilized During Treatment: Gait belt Activity Tolerance: Patient limited by fatigue Patient left: in chair;with call bell in reach;with family/visitor present;with bed alarm set Nurse Communication: Other (comment) (RN observed pt amb in hallway) General Behavior During Session: [facility] for tasks performed Cognition: [facility] for tasks performed  Felecia Shelling  PTA [facility]  Acute  Rehab Pager     [phone]

## 2011-09-20 NOTE — Procedures (Signed)
EEG ID:  [id number].  HISTORY:  This is a 78 years old man with confusion and altered mental status.  MEDICATIONS:  No anticonvulsant medications.  CONDITION OF RECORDING:  This 16-lead EEG was recorded with the patient in awake and drowsy states.  Background rhythm: background patterns in wakefulness were well organized with a well-sustained posterior dominant rhythm of 6.5 to 7 Hz, symmetrical and reactive to eye opening and closing.  Drowsiness was associated with mild attenuation of voltage and slowing frequencies.  Normal sleep patterns were seen.  Abnormal Potentials: no epileptiform activity or focal slowing was noted.  ACTIVATION PROCEDURES:  Hyperventilation was not performed.  Photic stimulation did not activate tracing.  EKG:  Single-channel of EKG monitoring detected an irregular rhythm.  IMPRESSION:  This was an abnormal awake and drowsy EEG due to presence of mild diffuse background slowing.  This finding may be suggestive of mild diffuse cerebral dysfunction and may be due to toxic metabolic Encephalopathy, neurodegenerative disorder and/or bi-hemispheric structural abnormality.  Single-channel of EKG monitoring detected an irregular rhythm.  If clinically warranted, clinical correlation is suggested.          ______________________________ Carmell Austria, MD    YQ:MVHQ D:  09/14/2011 13:16:44  T:  09/14/2011 22:24:56  Job #:  [id number]

## 2011-12-22 ENCOUNTER — Encounter (HOSPITAL_COMMUNITY): Payer: Self-pay | Admitting: Anesthesiology

## 2011-12-22 ENCOUNTER — Inpatient Hospital Stay (HOSPITAL_COMMUNITY): Payer: Medicare Other

## 2011-12-22 ENCOUNTER — Emergency Department (HOSPITAL_COMMUNITY): Payer: Medicare Other

## 2011-12-22 ENCOUNTER — Inpatient Hospital Stay (HOSPITAL_COMMUNITY): Payer: Medicare Other | Admitting: Anesthesiology

## 2011-12-22 ENCOUNTER — Encounter (HOSPITAL_COMMUNITY): Payer: Self-pay | Admitting: Internal Medicine

## 2011-12-22 ENCOUNTER — Inpatient Hospital Stay (HOSPITAL_COMMUNITY)
Admission: EM | Admit: 2011-12-22 | Discharge: 2011-12-26 | DRG: 470 | Disposition: A | Discharge status: Skilled Nursing Facility | Payer: Medicare Other | Attending: Internal Medicine | Admitting: Internal Medicine

## 2011-12-22 ENCOUNTER — Encounter (HOSPITAL_COMMUNITY): Admission: EM | Disposition: A | Discharge status: Skilled Nursing Facility | Payer: Self-pay | Source: Home / Self Care

## 2011-12-22 DIAGNOSIS — F3289 Other specified depressive episodes: Secondary | ICD-10-CM | POA: Diagnosis present

## 2011-12-22 DIAGNOSIS — F329 Major depressive disorder, single episode, unspecified: Secondary | ICD-10-CM | POA: Diagnosis present

## 2011-12-22 DIAGNOSIS — I10 Essential (primary) hypertension: Secondary | ICD-10-CM | POA: Diagnosis present

## 2011-12-22 DIAGNOSIS — F29 Unspecified psychosis not due to a substance or known physiological condition: Secondary | ICD-10-CM | POA: Diagnosis not present

## 2011-12-22 DIAGNOSIS — S72002A Fracture of unspecified part of neck of left femur, initial encounter for closed fracture: Secondary | ICD-10-CM

## 2011-12-22 DIAGNOSIS — S72009A Fracture of unspecified part of neck of unspecified femur, initial encounter for closed fracture: Principal | ICD-10-CM | POA: Diagnosis present

## 2011-12-22 DIAGNOSIS — F32A Depression, unspecified: Secondary | ICD-10-CM | POA: Diagnosis present

## 2011-12-22 DIAGNOSIS — Z79899 Other long term (current) drug therapy: Secondary | ICD-10-CM

## 2011-12-22 DIAGNOSIS — Z66 Do not resuscitate: Secondary | ICD-10-CM | POA: Diagnosis present

## 2011-12-22 DIAGNOSIS — W010XXA Fall on same level from slipping, tripping and stumbling without subsequent striking against object, initial encounter: Secondary | ICD-10-CM | POA: Diagnosis present

## 2011-12-22 DIAGNOSIS — N390 Urinary tract infection, site not specified: Secondary | ICD-10-CM | POA: Clinically undetermined

## 2011-12-22 HISTORY — PX: HIP ARTHROPLASTY: SHX981

## 2011-12-22 LAB — BASIC METABOLIC PANEL
BUN: 16 mg/dL (ref 6–23)
CO2: 27 mEq/L (ref 19–32)
Chloride: 101 mEq/L (ref 96–112)
Creatinine, Ser: 0.89 mg/dL (ref 0.50–1.35)
GFR calc Af Amer: >90 mL/min (ref >90)
Glucose, Bld: 144 mg/dL — ABNORMAL HIGH (ref 70–99)
Potassium: 4.3 mEq/L (ref 3.5–5.1)

## 2011-12-22 LAB — URINE MICROSCOPIC-ADD ON

## 2011-12-22 LAB — TYPE AND SCREEN: ABO/RH(D): O POS

## 2011-12-22 LAB — URINALYSIS, ROUTINE W REFLEX MICROSCOPIC
Nitrite: POSITIVE — AB
Specific Gravity, Urine: 1.024 (ref 1.005–1.030)
Urobilinogen, UA: 1 mg/dL (ref 0.0–1.0)
pH: 7.5 (ref 5.0–8.0)

## 2011-12-22 LAB — DIFFERENTIAL
Basophils Relative: 0 % (ref 0–1)
Lymphs Abs: 0.3 10*3/uL — ABNORMAL LOW (ref 0.7–4.0)
Monocytes Absolute: 0.8 10*3/uL (ref 0.1–1.0)
Monocytes Relative: 6 % (ref 3–12)
Neutro Abs: 10.9 10*3/uL — ABNORMAL HIGH (ref 1.7–7.7)
Neutrophils Relative %: 91 % — ABNORMAL HIGH (ref 43–77)

## 2011-12-22 LAB — CBC
HCT: 39.4 % (ref 39.0–52.0)
Hemoglobin: 13.3 g/dL (ref 13.0–17.0)
MCH: 31.9 pg (ref 26.0–34.0)
MCHC: 33.8 g/dL (ref 30.0–36.0)
RBC: 4.17 MIL/uL — ABNORMAL LOW (ref 4.22–5.81)

## 2011-12-22 LAB — APTT: aPTT: 26 seconds (ref 24–37)

## 2011-12-22 SURGERY — HEMIARTHROPLASTY, HIP, DIRECT ANTERIOR APPROACH, FOR FRACTURE
Anesthesia: General | Site: Hip | Laterality: Left | Wound class: Clean

## 2011-12-22 MED ORDER — HYDROCODONE-ACETAMINOPHEN 5-325 MG PO TABS: 1.0000 | ORAL_TABLET | Freq: Four times a day (QID) | ORAL | Status: DC | PRN

## 2011-12-22 MED ORDER — METHOCARBAMOL 100 MG/ML IJ SOLN
500.0000 mg | Freq: Four times a day (QID) | INTRAVENOUS | Status: DC | PRN
  Filled 2011-12-22: qty 5

## 2011-12-22 MED ORDER — 0.9 % SODIUM CHLORIDE (POUR BTL) OPTIME
TOPICAL | Status: DC | PRN
  Administered 2011-12-22: 1000 mL

## 2011-12-22 MED ORDER — FENTANYL CITRATE 0.05 MG/ML IJ SOLN
INTRAMUSCULAR | Status: DC | PRN
  Administered 2011-12-22 (×2): 50 ug via INTRAVENOUS

## 2011-12-22 MED ORDER — PHENOL 1.4 % MT LIQD: 1.0000 | OROMUCOSAL | Status: DC | PRN

## 2011-12-22 MED ORDER — PROMETHAZINE HCL 25 MG/ML IJ SOLN: 6.2500 mg | INTRAMUSCULAR | Status: DC | PRN

## 2011-12-22 MED ORDER — MORPHINE SULFATE 2 MG/ML IJ SOLN
0.5000 mg | INTRAMUSCULAR | Status: DC | PRN
  Administered 2011-12-22 (×2): 0.5 mg via INTRAVENOUS
  Filled 2011-12-22 (×2): qty 1

## 2011-12-22 MED ORDER — MORPHINE SULFATE 4 MG/ML IJ SOLN
2.0000 mg | Freq: Once | INTRAMUSCULAR | Status: AC
  Administered 2011-12-22: 2 mg via INTRAVENOUS
  Filled 2011-12-22: qty 1

## 2011-12-22 MED ORDER — MENTHOL 3 MG MT LOZG: 1.0000 | LOZENGE | OROMUCOSAL | Status: DC | PRN

## 2011-12-22 MED ORDER — ARIPIPRAZOLE 5 MG PO TABS: 2.5000 mg | ORAL_TABLET | Freq: Two times a day (BID) | ORAL | Status: DC

## 2011-12-22 MED ORDER — POLYETHYLENE GLYCOL 3350 17 G PO PACK
17.0000 g | PACK | Freq: Every day | ORAL | Status: DC
  Administered 2011-12-23 – 2011-12-25 (×3): 17 g via ORAL

## 2011-12-22 MED ORDER — ONDANSETRON HCL 4 MG PO TABS: 4.0000 mg | ORAL_TABLET | Freq: Four times a day (QID) | ORAL | Status: DC | PRN

## 2011-12-22 MED ORDER — LACTATED RINGERS IV SOLN
INTRAVENOUS | Status: DC
  Administered 2011-12-22: 1000 mL via INTRAVENOUS

## 2011-12-22 MED ORDER — ACETAMINOPHEN 650 MG RE SUPP: 650.0000 mg | Freq: Four times a day (QID) | RECTAL | Status: DC | PRN

## 2011-12-22 MED ORDER — DOCUSATE SODIUM 100 MG PO CAPS
100.0000 mg | ORAL_CAPSULE | Freq: Two times a day (BID) | ORAL | Status: DC
  Administered 2011-12-23 – 2011-12-25 (×6): 100 mg via ORAL

## 2011-12-22 MED ORDER — DEXTROSE 5 % IV SOLN
500.0000 mg | Freq: Four times a day (QID) | INTRAVENOUS | Status: DC | PRN
  Filled 2011-12-22: qty 5

## 2011-12-22 MED ORDER — FERROUS SULFATE 325 (65 FE) MG PO TABS
325.0000 mg | ORAL_TABLET | Freq: Three times a day (TID) | ORAL | Status: DC
  Administered 2011-12-22 – 2011-12-26 (×11): 325 mg via ORAL
  Filled 2011-12-22 (×14): qty 1

## 2011-12-22 MED ORDER — CHLORHEXIDINE GLUCONATE 4 % EX LIQD
60.0000 mL | Freq: Once | CUTANEOUS | Status: AC
  Administered 2011-12-22: 4 via TOPICAL
  Filled 2011-12-22: qty 60

## 2011-12-22 MED ORDER — METOPROLOL TARTRATE 1 MG/ML IV SOLN
2.5000 mg | Freq: Two times a day (BID) | INTRAVENOUS | Status: DC
  Administered 2011-12-22 – 2011-12-25 (×7): 2.5 mg via INTRAVENOUS
  Filled 2011-12-22 (×10): qty 5

## 2011-12-22 MED ORDER — ENOXAPARIN SODIUM 30 MG/0.3ML ~~LOC~~ SOLN
30.0000 mg | Freq: Two times a day (BID) | SUBCUTANEOUS | Status: DC
  Administered 2011-12-23 – 2011-12-26 (×7): 30 mg via SUBCUTANEOUS
  Filled 2011-12-22 (×8): qty 0.3

## 2011-12-22 MED ORDER — ONDANSETRON HCL 4 MG/2ML IJ SOLN
INTRAMUSCULAR | Status: DC | PRN
  Administered 2011-12-22: 4 mg via INTRAVENOUS

## 2011-12-22 MED ORDER — VENLAFAXINE HCL ER 150 MG PO CP24
225.0000 mg | ORAL_CAPSULE | Freq: Every day | ORAL | Status: DC
  Filled 2011-12-22: qty 1

## 2011-12-22 MED ORDER — LIDOCAINE HCL (CARDIAC) 20 MG/ML IV SOLN
INTRAVENOUS | Status: DC | PRN
  Administered 2011-12-22: 50 mg via INTRAVENOUS

## 2011-12-22 MED ORDER — CEFAZOLIN SODIUM-DEXTROSE 2-3 GM-% IV SOLR
INTRAVENOUS | Status: AC
  Filled 2011-12-22: qty 50

## 2011-12-22 MED ORDER — PROPOFOL 10 MG/ML IV EMUL
INTRAVENOUS | Status: DC | PRN
  Administered 2011-12-22: 110 mg via INTRAVENOUS

## 2011-12-22 MED ORDER — ARIPIPRAZOLE 2 MG PO TABS
2.0000 mg | ORAL_TABLET | Freq: Two times a day (BID) | ORAL | Status: DC
  Administered 2011-12-22 – 2011-12-26 (×8): 2 mg via ORAL
  Filled 2011-12-22 (×10): qty 1

## 2011-12-22 MED ORDER — LACTATED RINGERS IV SOLN
INTRAVENOUS | Status: DC | PRN
  Administered 2011-12-22 (×2): via INTRAVENOUS

## 2011-12-22 MED ORDER — METHOCARBAMOL 500 MG PO TABS
500.0000 mg | ORAL_TABLET | Freq: Four times a day (QID) | ORAL | Status: DC | PRN
  Administered 2011-12-25 – 2011-12-26 (×3): 500 mg via ORAL
  Filled 2011-12-22 (×3): qty 1

## 2011-12-22 MED ORDER — CEFAZOLIN SODIUM-DEXTROSE 2-3 GM-% IV SOLR
2.0000 g | Freq: Once | INTRAVENOUS | Status: DC
  Filled 2011-12-22: qty 50

## 2011-12-22 MED ORDER — METOCLOPRAMIDE HCL 10 MG PO TABS: 5.0000 mg | ORAL_TABLET | Freq: Three times a day (TID) | ORAL | Status: DC | PRN

## 2011-12-22 MED ORDER — HYDROMORPHONE HCL PF 1 MG/ML IJ SOLN: 0.2500 mg | INTRAMUSCULAR | Status: DC | PRN

## 2011-12-22 MED ORDER — SODIUM CHLORIDE 0.9 % IV SOLN
INTRAVENOUS | Status: AC
  Administered 2011-12-22 (×2): via INTRAVENOUS

## 2011-12-22 MED ORDER — CEFAZOLIN SODIUM 1-5 GM-% IV SOLN
INTRAVENOUS | Status: DC | PRN
  Administered 2011-12-22: 2 g via INTRAVENOUS

## 2011-12-22 MED ORDER — SODIUM CHLORIDE 0.9 % IV SOLN
INTRAVENOUS | Status: DC
  Administered 2011-12-23 (×2): via INTRAVENOUS

## 2011-12-22 MED ORDER — ONDANSETRON HCL 4 MG/2ML IJ SOLN: 4.0000 mg | Freq: Four times a day (QID) | INTRAMUSCULAR | Status: DC | PRN

## 2011-12-22 MED ORDER — ROCURONIUM BROMIDE 100 MG/10ML IV SOLN
INTRAVENOUS | Status: DC | PRN
  Administered 2011-12-22: 30 mg via INTRAVENOUS

## 2011-12-22 MED ORDER — CEFAZOLIN SODIUM-DEXTROSE 2-3 GM-% IV SOLR
2.0000 g | Freq: Four times a day (QID) | INTRAVENOUS | Status: DC
  Filled 2011-12-22: qty 50

## 2011-12-22 MED ORDER — CEFAZOLIN SODIUM-DEXTROSE 2-3 GM-% IV SOLR
2.0000 g | Freq: Four times a day (QID) | INTRAVENOUS | Status: AC
  Administered 2011-12-22 – 2011-12-23 (×2): 2 g via INTRAVENOUS
  Filled 2011-12-22 (×2): qty 50

## 2011-12-22 MED ORDER — BACITRACIN ZINC 500 UNIT/GM EX OINT
1.0000 "application " | TOPICAL_OINTMENT | Freq: Two times a day (BID) | CUTANEOUS | Status: DC
  Administered 2011-12-22 – 2011-12-26 (×9): 1 via TOPICAL
  Filled 2011-12-22: qty 15
  Filled 2011-12-22: qty 0.9

## 2011-12-22 MED ORDER — ACETAMINOPHEN 325 MG PO TABS
650.0000 mg | ORAL_TABLET | Freq: Four times a day (QID) | ORAL | Status: DC | PRN
  Administered 2011-12-23 – 2011-12-26 (×5): 650 mg via ORAL
  Filled 2011-12-22 (×5): qty 2

## 2011-12-22 MED ORDER — METHOCARBAMOL 500 MG PO TABS: 500.0000 mg | ORAL_TABLET | Freq: Four times a day (QID) | ORAL | Status: DC | PRN

## 2011-12-22 MED ORDER — MORPHINE SULFATE 2 MG/ML IJ SOLN: 0.5000 mg | INTRAMUSCULAR | Status: DC | PRN

## 2011-12-22 MED ORDER — METOCLOPRAMIDE HCL 5 MG/ML IJ SOLN: 5.0000 mg | Freq: Three times a day (TID) | INTRAMUSCULAR | Status: DC | PRN

## 2011-12-22 MED ORDER — SUCCINYLCHOLINE CHLORIDE 20 MG/ML IJ SOLN
INTRAMUSCULAR | Status: DC | PRN
  Administered 2011-12-22: 100 mg via INTRAVENOUS

## 2011-12-22 MED ORDER — FINASTERIDE 5 MG PO TABS
5.0000 mg | ORAL_TABLET | Freq: Every day | ORAL | Status: DC
  Administered 2011-12-23 – 2011-12-26 (×4): 5 mg via ORAL
  Filled 2011-12-22 (×4): qty 1

## 2011-12-22 SURGICAL SUPPLY — 39 items
BAG ZIPLOCK 12X15 (MISCELLANEOUS) IMPLANT
BLADE SAW SAG 73X25 THK (BLADE) ×1
BLADE SAW SGTL 73X25 THK (BLADE) ×1 IMPLANT
CLOTH BEACON ORANGE TIMEOUT ST (SAFETY) ×2 IMPLANT
CLSR STERI-STRIP ANTIMIC 1/2X4 (GAUZE/BANDAGES/DRESSINGS) ×2 IMPLANT
DRAPE INCISE IOBAN 66X45 STRL (DRAPES) ×2 IMPLANT
DRAPE ORTHO SPLIT 77X108 STRL (DRAPES) ×2
DRAPE POUCH INSTRU U-SHP 10X18 (DRAPES) ×2 IMPLANT
DRAPE SURG ORHT 6 SPLT 77X108 (DRAPES) ×2 IMPLANT
DRAPE U-SHAPE 47X51 STRL (DRAPES) ×2 IMPLANT
DRSG EMULSION OIL 3X16 NADH (GAUZE/BANDAGES/DRESSINGS) IMPLANT
DRSG MEPILEX BORDER 4X4 (GAUZE/BANDAGES/DRESSINGS) IMPLANT
DRSG MEPILEX BORDER 4X8 (GAUZE/BANDAGES/DRESSINGS) ×2 IMPLANT
ELECT REM PT RETURN 9FT ADLT (ELECTROSURGICAL) ×2
ELECTRODE REM PT RTRN 9FT ADLT (ELECTROSURGICAL) ×1 IMPLANT
EVACUATOR 1/8 PVC DRAIN (DRAIN) IMPLANT
GLOVE BIOGEL PI IND STRL 8.5 (GLOVE) ×2 IMPLANT
GLOVE BIOGEL PI INDICATOR 8.5 (GLOVE) ×2
GLOVE ORTHO TXT STRL SZ7.5 (GLOVE) ×2 IMPLANT
GLOVE SURG ORTHO 8.5 STRL (GLOVE) ×2 IMPLANT
GOWN STRL NON-REIN LRG LVL3 (GOWN DISPOSABLE) ×4 IMPLANT
GOWN STRL REIN 2XL LVL4 (GOWN DISPOSABLE) ×2 IMPLANT
IMMOBILIZER KNEE 20 (SOFTGOODS)
IMMOBILIZER KNEE 20 THIGH 36 (SOFTGOODS) IMPLANT
MANIFOLD NEPTUNE II (INSTRUMENTS) ×2 IMPLANT
NS IRRIG 1000ML POUR BTL (IV SOLUTION) ×2 IMPLANT
PACK TOTAL JOINT (CUSTOM PROCEDURE TRAY) ×2 IMPLANT
POSITIONER SURGICAL ARM (MISCELLANEOUS) ×2 IMPLANT
SPONGE GAUZE 4X4 12PLY (GAUZE/BANDAGES/DRESSINGS) IMPLANT
STAPLER VISISTAT 35W (STAPLE) IMPLANT
STRIP CLOSURE SKIN 1/2X4 (GAUZE/BANDAGES/DRESSINGS) ×2 IMPLANT
SUT MNCRL AB 4-0 PS2 18 (SUTURE) ×2 IMPLANT
SUT VIC AB 1 CT1 36 (SUTURE) ×10 IMPLANT
SUT VIC AB 2-0 CT1 27 (SUTURE) ×3
SUT VIC AB 2-0 CT1 TAPERPNT 27 (SUTURE) ×3 IMPLANT
TOWEL OR 17X26 10 PK STRL BLUE (TOWEL DISPOSABLE) ×4 IMPLANT
TOWER CARTRIDGE SMART MIX (DISPOSABLE) IMPLANT
TRAY FOLEY CATH 14FRSI W/METER (CATHETERS) IMPLANT
WATER STERILE IRR 1500ML POUR (IV SOLUTION) ×2 IMPLANT

## 2011-12-22 NOTE — ED Notes (Signed)
Bed:[facility]<BR> Expected date:12/22/11<BR> Expected time: 2:17 AM<BR> Means of arrival:Ambulance<BR> Comments:<BR> Fall hip pain

## 2011-12-22 NOTE — ED Notes (Signed)
Pt states that he got tangled up walking earlier and fell on his bottom. Pt states that he now has L hip tenderness and a skin tear on his L elbow. Pt's baseline is unsteady as he walks and his thoughts tend to ramble due to a fall 6 months ago.

## 2011-12-22 NOTE — Transfer of Care (Signed)
Immediate Anesthesia Transfer of Care Note  Patient: Juan Diaz  Procedure(s) Performed: Procedure(s) (LRB): ARTHROPLASTY BIPOLAR HIP (Left)  Patient Location: PACU  Anesthesia Type: General  Level of Consciousness: sedated  Airway & Oxygen Therapy: Patient Spontanous Breathing and Patient connected to face mask oxygen  Post-op Assessment: Report given to PACU RN and Post -op Vital signs reviewed and stable  Post vital signs: Reviewed and stable  Complications: No apparent anesthesia complications

## 2011-12-22 NOTE — ED Provider Notes (Signed)
History     CSN: [id number]  Arrival date & time 12/22/11  0223   First MD Initiated Contact with Patient 12/22/11 0251      Chief Complaint  Patient presents with  . Hip Pain    (Consider location/radiation/quality/duration/timing/severity/associated sxs/prior treatment) HPI Comments: 78 year old male with a history of hypertension, recent fall which required placement in a nursing facility for rehabilitation. He has recently gone back to his house and has been ambulating with a walker, tonight he got tangled while walking on a stool, fell landing on his left hip. He required significant help getting up, has not been able to walk since that time, has pain in the left hip which is worse with movement of the left leg, persistent, moderate, not associated with head injury, chest pain, shortness of breath, swelling. He does have an associated laceration to the left elbow.  Patient is a 78 y.o. male presenting with hip pain. The history is provided by the patient and the spouse.  Hip Pain    Past Medical History  Diagnosis Date  . Hypertension   . Urinary retention     No past surgical history on file.  No family history on file.  History  Substance Use Topics  . Smoking status: Never Smoker   . Smokeless tobacco: Not on file  . Alcohol Use: No      Review of Systems  All other systems reviewed and are negative.    Allergies  Review of patient's allergies indicates no known allergies.  Home Medications   Current Outpatient Rx  Name Route Sig Dispense Refill  . ALPRAZOLAM 0.25 MG PO TABS Oral Take 0.125 mg by mouth daily as needed. For anxiety    . ARIPIPRAZOLE 5 MG PO TABS Oral Take 2.5 mg by mouth 2 (two) times daily.    . ASPIRIN 81 MG PO CHEW Oral Chew 81 mg by mouth daily.    . BUPROPION HCL ER (XL) 300 MG PO TB24 Oral Take 300 mg by mouth daily.    Marland Kitchen ENSURE CLINICAL ST REVIGOR PO LIQD Oral Take 237 mLs by mouth at bedtime. 10 Bottle 0  . FINASTERIDE 5 MG PO  TABS Oral Take 5 mg by mouth daily.      Marland Kitchen LOSARTAN POTASSIUM 100 MG PO TABS Oral Take 100 mg by mouth daily.      . ADULT MULTIVITAMIN W/MINERALS CH Oral Take 1 tablet by mouth daily.    [name] PO TABS Oral Take 1 tablet by mouth daily.      . VENLAFAXINE HCL ER 75 MG PO CP24 Oral Take 225 mg by mouth daily.      . ARIPIPRAZOLE 2 MG PO TABS Oral Take 1 tablet (2 mg total) by mouth 2 (two) times daily. 20 tablet 0    BP 131/69  Pulse 96  Temp 99.1 F (37.3 C) (Oral)  Resp 16  SpO2 97%  Physical Exam  Nursing note and vitals reviewed. Constitutional: He appears well-developed and well-nourished. No distress.  HENT:  Head: Normocephalic and atraumatic.  Mouth/Throat: No oropharyngeal exudate.       Mucous membranes dry  Eyes: Conjunctivae and EOM are normal. Pupils are equal, round, and reactive to light. Right eye exhibits no discharge. Left eye exhibits no discharge. No scleral icterus.  Neck: Normal range of motion. Neck supple. No JVD present. No thyromegaly present.  Cardiovascular: Normal rate, regular rhythm and intact distal pulses.  Exam reveals no gallop and no  friction rub.   Murmur ( Soft systolic murmur) heard.      Strong peripheral pulses at the radial arteries, brisk capillary refill  Pulmonary/Chest: Effort normal and breath sounds normal. No respiratory distress. He has no wheezes. He has no rales.  Abdominal: Soft. Bowel sounds are normal. He exhibits no distension and no mass. There is no tenderness.  Musculoskeletal: Normal range of motion. He exhibits no edema and no tenderness.       L leg with shortening and external rotation.  And pain with ROM.  Normal pulses in foot on the L.  Lymphadenopathy:    He has no cervical adenopathy.  Neurological: He is alert. Coordination normal.       Speech clear, follows commands, moves all extremities x4  Skin: Skin is warm and dry. No rash noted. No erythema.       0.5 cm laceration superficial skin tear to the  left elbow  Psychiatric: He has a normal mood and affect. His behavior is normal.    ED Course  Procedures (including critical care time)  Labs Reviewed  CBC - Abnormal; Notable for the following:    WBC 12.0 (*)     RBC 4.17 (*)     All other components within normal limits  DIFFERENTIAL - Abnormal; Notable for the following:    Neutrophils Relative 91 (*)     Neutro Abs 10.9 (*)     Lymphocytes Relative 3 (*)     Lymphs Abs 0.3 (*)     All other components within normal limits  BASIC METABOLIC PANEL  APTT  PROTIME-INR  TYPE AND SCREEN   Dg Chest 1 View  12/22/2011  *RADIOLOGY REPORT*  Clinical Data: Left hip fracture; preoperative chest radiograph.  CHEST - 1 VIEW  Comparison: Chest radiograph performed 09/11/2011  Findings: There is persistent mild elevation of the left hemidiaphragm.  Mild bibasilar opacities likely reflect atelectasis.  Mild vascular congestion is seen, without significant pulmonary edema.  No pleural effusion or pneumothorax is seen.  The cardiomediastinal silhouette is normal in size.  Calcification is noted within the aortic arch.  No acute osseous abnormalities are identified.  IMPRESSION: Mild bibasilar opacities likely reflect atelectasis; mild vascular congestion, without significant pulmonary edema.  Original Report Authenticated By: Tonia Ghent, M.D.   Dg Elbow Complete Left  12/22/2011  *RADIOLOGY REPORT*  Clinical Data: Status post fall; laceration to the olecranon region.  LEFT ELBOW - COMPLETE 3+ VIEW  Comparison: None.  Findings: There is no evidence of fracture or dislocation.  The visualized joint spaces are preserved.  No significant joint effusion is identified.  The known soft tissue laceration is difficult to fully characterize on radiograph.  IMPRESSION: No evidence of fracture or dislocation.  Original Report Authenticated By: Tonia Ghent, M.D.   Dg Hip Complete Left  12/22/2011  *RADIOLOGY REPORT*  Clinical Data: Status post fall; left  hip pain.  LEFT HIP - COMPLETE 2+ VIEW  Comparison: None.  Findings: There is a comminuted subcapital fracture of the left femoral neck, with mild displacement of fragments.  The left femoral head remains seated at the acetabulum.  No additional fractures are seen.  Mild superior joint space narrowing is noted at the right hip; the right hip is otherwise grossly unremarkable in appearance.  The sacroiliac joints are unremarkable in appearance.  The visualized bowel gas pattern is grossly unremarkable in appearance.  IMPRESSION: Comminuted subcapital fracture of the left femoral neck, with mild displacement of fragments.  Original  Report Authenticated By: Tonia Ghent, M.D.     1. Hip fracture, left       MDM  Imaging of hip and elbow and chest as preop, suspect hip fracture given shortening and external rotation, otherwise patient has no other signs of significant injury, no head injury and this does appear to be a mechanical fall. He does not have an orthopedist and has no history of fractures  ED ECG REPORT   Date: 12/22/2011   Rate: 96  Rhythm: normal sinus rhythm  QRS Axis: normal  Intervals: QRS prolonged  ST/T Wave abnormalities: normal  Conduction Disutrbances:right bundle branch block  Narrative Interpretation:   Old EKG Reviewed: unchanged  D/w dr. Ranell Patrick with ortho and with Dr. Adela Glimpse of [facility] - to admit.   Vida Roller, MD 12/22/11 [phone]

## 2011-12-22 NOTE — Anesthesia Preprocedure Evaluation (Addendum)
Anesthesia Evaluation  Patient identified by MRN, date of birth, ID band Patient confused    Reviewed: Allergy & Precautions, H&P , NPO status , Patient's Chart, lab work & pertinent test results  Airway Mallampati: II TM Distance: >3 FB Neck ROM: Limited    Dental  (+) Missing and Chipped   Pulmonary neg pulmonary ROS,  breath sounds clear to auscultation  Pulmonary exam normal       Cardiovascular hypertension, Pt. on medications negative cardio ROS  Rhythm:Regular Rate:Normal     Neuro/Psych Depression dementianegative neurological ROS     GI/Hepatic negative GI ROS, Neg liver ROS,   Endo/Other  negative endocrine ROS  Renal/GU negative Renal ROS  negative genitourinary   Musculoskeletal negative musculoskeletal ROS (+)   Abdominal   Peds negative pediatric ROS (+)  Hematology negative hematology ROS (+)   Anesthesia Other Findings   Reproductive/Obstetrics negative OB ROS                         Anesthesia Physical Anesthesia Plan  ASA: III  Anesthesia Plan: General   Post-op Pain Management:    Induction: Intravenous  Airway Management Planned: Oral ETT  Additional Equipment:   Intra-op Plan:   Post-operative Plan: Extubation in OR  Informed Consent: I have reviewed the patients History and Physical, chart, labs and discussed the procedure including the risks, benefits and alternatives for the proposed anesthesia with the patient or authorized representative who has indicated his/her understanding and acceptance.   Dental advisory given  Plan Discussed with: CRNA  Anesthesia Plan Comments:        Anesthesia Quick Evaluation

## 2011-12-22 NOTE — Consult Note (Signed)
Reason for Consult:L hip fracture Referring Physician: Dr Hyacinth Meeker, ED  Deunte Bledsoe is an 78 y.o. male.  HPI: 78 yo male with history of frequent falls who fell today and injured his left hip.  Unable to stand after fall.  Complains of left groin pain.  No other complaints.  Past Medical History  Diagnosis Date  . Hypertension   . Urinary retention     No past surgical history on file.  No family history on file.  Social History:  reports that he has never smoked. He does not have any smokeless tobacco history on file. He reports that he does not drink alcohol or use illicit drugs.  Allergies: No Known Allergies  Medications: I have reviewed the patient's current medications.  Results for orders placed during the hospital encounter of 12/22/11 (from the past 48 hour(s))  CBC     Status: Abnormal   Collection Time   12/22/11  4:10 AM      Component Value Range Comment   WBC 12.0 (*) 4.0 - 10.5 K/uL    RBC 4.17 (*) 4.22 - 5.81 MIL/uL    Hemoglobin 13.3  13.0 - 17.0 g/dL    HCT 16.1  09.6 - 04.5 %    MCV 94.5  78.0 - 100.0 fL    MCH 31.9  26.0 - 34.0 pg    MCHC 33.8  30.0 - 36.0 g/dL    RDW 40.9  81.1 - 91.4 %    Platelets 192  150 - 400 K/uL   DIFFERENTIAL     Status: Abnormal   Collection Time   12/22/11  4:10 AM      Component Value Range Comment   Neutrophils Relative 91 (*) 43 - 77 %    Neutro Abs 10.9 (*) 1.7 - 7.7 K/uL    Lymphocytes Relative 3 (*) 12 - 46 %    Lymphs Abs 0.3 (*) 0.7 - 4.0 K/uL    Monocytes Relative 6  3 - 12 %    Monocytes Absolute 0.8  0.1 - 1.0 K/uL    Eosinophils Relative 0  0 - 5 %    Eosinophils Absolute 0.0  0.0 - 0.7 K/uL    Basophils Relative 0  0 - 1 %    Basophils Absolute 0.0  0.0 - 0.1 K/uL   BASIC METABOLIC PANEL     Status: Abnormal   Collection Time   12/22/11  4:10 AM      Component Value Range Comment   Sodium 137  135 - 145 mEq/L    Potassium 4.3  3.5 - 5.1 mEq/L    Chloride 101  96 - 112 mEq/L    CO2 27  19 - 32 mEq/L     Glucose, Bld 144 (*) 70 - 99 mg/dL    BUN 16  6 - 23 mg/dL    Creatinine, Ser 7.82  0.50 - 1.35 mg/dL    Calcium 9.4  8.4 - 95.6 mg/dL    GFR calc non Af Amer 81 (*) >90 mL/min    GFR calc Af Amer >90  >90 mL/min   APTT     Status: Normal   Collection Time   12/22/11  4:10 AM      Component Value Range Comment   aPTT 26  24 - 37 seconds   PROTIME-INR     Status: Normal   Collection Time   12/22/11  4:10 AM      Component Value Range Comment  Prothrombin Time 14.1  11.6 - 15.2 seconds    INR 1.07  0.00 - 1.49     Dg Chest 1 View  12/22/2011  *RADIOLOGY REPORT*  Clinical Data: Left hip fracture; preoperative chest radiograph.  CHEST - 1 VIEW  Comparison: Chest radiograph performed 09/11/2011  Findings: There is persistent mild elevation of the left hemidiaphragm.  Mild bibasilar opacities likely reflect atelectasis.  Mild vascular congestion is seen, without significant pulmonary edema.  No pleural effusion or pneumothorax is seen.  The cardiomediastinal silhouette is normal in size.  Calcification is noted within the aortic arch.  No acute osseous abnormalities are identified.  IMPRESSION: Mild bibasilar opacities likely reflect atelectasis; mild vascular congestion, without significant pulmonary edema.  Original Report Authenticated By: Tonia Ghent, M.D.   Dg Elbow Complete Left  12/22/2011  *RADIOLOGY REPORT*  Clinical Data: Status post fall; laceration to the olecranon region.  LEFT ELBOW - COMPLETE 3+ VIEW  Comparison: None.  Findings: There is no evidence of fracture or dislocation.  The visualized joint spaces are preserved.  No significant joint effusion is identified.  The known soft tissue laceration is difficult to fully characterize on radiograph.  IMPRESSION: No evidence of fracture or dislocation.  Original Report Authenticated By: Tonia Ghent, M.D.   Dg Hip Complete Left  12/22/2011  *RADIOLOGY REPORT*  Clinical Data: Status post fall; left hip pain.  LEFT HIP - COMPLETE  2+ VIEW  Comparison: None.  Findings: There is a comminuted subcapital fracture of the left femoral neck, with mild displacement of fragments.  The left femoral head remains seated at the acetabulum.  No additional fractures are seen.  Mild superior joint space narrowing is noted at the right hip; the right hip is otherwise grossly unremarkable in appearance.  The sacroiliac joints are unremarkable in appearance.  The visualized bowel gas pattern is grossly unremarkable in appearance.  IMPRESSION: Comminuted subcapital fracture of the left femoral neck, with mild displacement of fragments.  Original Report Authenticated By: Tonia Ghent, M.D.    ROS Blood pressure 131/69, pulse 96, temperature 99.1 F (37.3 C), temperature source Oral, resp. rate 16, SpO2 97.00%. Physical Exam Elderly male in mod distress.  Cervical spine nontender with pain free ROM, Bilateral shoulders nonswollen and nontender, full ROM,  Left elbow skin tear, otherwise normal bilat UEs  Right LE, pain free ROM, no deformity L LE: shortened and externally rotated, NVI  Assessment/Plan: 78 yo male with history of frequent falls s/p left subcapital femoral neck fracture. Medicine seeing and admitting patient now. Plan left hip hemi arthroplasty later today if cleared medically.  Discussed with the patient and his wife who is at the bedside and agrees with the plan. Risks and benefits discussed.  Lamona Eimer,STEVEN R 12/22/2011, 5:21 AM

## 2011-12-22 NOTE — Anesthesia Postprocedure Evaluation (Signed)
  Anesthesia Post-op Note  Patient: Juan Diaz  Procedure(s) Performed: Procedure(s) (LRB): ARTHROPLASTY BIPOLAR HIP (Left)  Patient Location: PACU  Anesthesia Type: General  Level of Consciousness: sedated and confused  Airway and Oxygen Therapy: Patient Spontanous Breathing and Patient connected to nasal cannula oxygen  Post-op Pain: mild  Post-op Assessment: Post-op Vital signs reviewed, Patient's Cardiovascular Status Stable, Respiratory Function Stable and Patent Airway  Post-op Vital Signs: stable  Complications: No apparent anesthesia complications

## 2011-12-22 NOTE — Progress Notes (Signed)
Patient seen and examined after returning from OR. Wife at bedside  he is still sleepy from the anesthesia. Patient had left hip arthroplasty which he tolerated well  Vitals stable Continue pain meds as ordered DVT prophylaxis Resume diet once more awake   PT eval in am

## 2011-12-22 NOTE — Brief Op Note (Signed)
12/22/2011  4:31 PM  PATIENT:  Georgiana Shore  78 y.o. male  PRE-OPERATIVE DIAGNOSIS:  hip fracture, left subcapital POST-OPERATIVE DIAGNOSIS:  hip fracture, left subcapital   PROCEDURE:  Procedure(s) (LRB): ARTHROPLASTY BIPOLAR HIP (Left)  SURGEON:  Surgeon(s) and Role:    * Verlee Rossetti, MD - Primary  PHYSICIAN ASSISTANT:   ASSISTANTS: Thea Gist, PA-C   ANESTHESIA:   general  EBL:  Total I/O In: 1525 [I.V.:1525] Out: 775 [Urine:625; Blood:150]  BLOOD ADMINISTERED:none  DRAINS: none   LOCAL MEDICATIONS USED:  NONE  SPECIMEN:  No Specimen  DISPOSITION OF SPECIMEN:  N/A  COUNTS:  YES  TOURNIQUET:  * No tourniquets in log *  DICTATION: .Other Dictation: Dictation Number [id number]  PLAN OF CARE: Admit to inpatient   PATIENT DISPOSITION:  PACU - hemodynamically stable.   Delay start of Pharmacological VTE agent (>24hrs) due to surgical blood loss or risk of bleeding: no

## 2011-12-22 NOTE — H&P (Signed)
PCP:   Durwin Nora with [facility]   Chief Complaint:   Tripped and fell  HPI: Juan Diaz is a 78 y.o. male   has a past medical history of Hypertension and Urinary retention.   Presented with  Tripped on the chair and fell around 9 pm. Denies any chest pain or SOB. He goes out and works out at [employer] on daily bases. No dementia.   Review of Systems:    Pertinent positives include: constipation  Constitutional:  No weight loss, night sweats, Fevers, chills, fatigue, weight loss  HEENT:  No headaches, Difficulty swallowing,Tooth/dental problems,Sore throat,  No sneezing, itching, ear ache, nasal congestion, post nasal drip,  Cardio-vascular:  No chest pain, Orthopnea, PND, anasarca, dizziness, palpitations.no Bilateral lower extremity swelling  GI:  No heartburn, indigestion, abdominal pain, nausea, vomiting, diarrhea, change in bowel habits, loss of appetite, melena, blood in stool, hematemesis Resp:  no shortness of breath at rest. No dyspnea on exertion, No excess mucus, no productive cough, No non-productive cough, No coughing up of blood.No change in color of mucus.No wheezing. Skin:  no rash or lesions. No jaundice GU:  no dysuria, change in color of urine, no urgency or frequency. No straining to urinate.  No flank pain.  Musculoskeletal:  No joint pain or no joint swelling. No decreased range of motion. No back pain.  Psych:  No change in mood or affect. No depression or anxiety. No memory loss.  Neuro: no localizing neurological complaints, no tingling, no weakness, no double vision, no gait abnormality, no slurred speech, no confusion  Otherwise ROS are negative except for above, 10 systems were reviewed  Past Medical History: Past Medical History  Diagnosis Date  . Hypertension   . Urinary retention    History reviewed. No pertinent past surgical history.   Medications: Prior to Admission medications   Medication Sig Start Date End Date Taking?  Authorizing Provider  ALPRAZolam (XANAX) 0.25 MG tablet Take 0.125 mg by mouth daily as needed. For anxiety   Yes Historical Provider, MD  ARIPiprazole (ABILIFY) 5 MG tablet Take 2.5 mg by mouth 2 (two) times daily.   Yes Historical Provider, MD  aspirin 81 MG chewable tablet Chew 81 mg by mouth daily.   Yes Historical Provider, MD  buPROPion (WELLBUTRIN XL) 300 MG 24 hr tablet Take 300 mg by mouth daily.   Yes Historical Provider, MD  feeding supplement (ENSURE CLINICAL STRENGTH) LIQD Take 237 mLs by mouth at bedtime. 09/15/11  Yes Simbiso Ranga, MD  finasteride (PROSCAR) 5 MG tablet Take 5 mg by mouth daily.     Yes Historical Provider, MD  losartan (COZAAR) 100 MG tablet Take 100 mg by mouth daily.     Yes Historical Provider, MD  Multiple Vitamin (MULTIVITAMIN WITH MINERALS) TABS Take 1 tablet by mouth daily.   Yes Historical Provider, MD  Multiple Vitamins-Minerals (MULTIVITAMINS THER. W/MINERALS) TABS Take 1 tablet by mouth daily.     Yes Historical Provider, MD  venlafaxine (EFFEXOR-XR) 75 MG 24 hr capsule Take 225 mg by mouth daily.     Yes Historical Provider, MD  ARIPiprazole (ABILIFY) 2 MG tablet Take 1 tablet (2 mg total) by mouth 2 (two) times daily. 09/15/11 10/15/11  Simbiso Ranga, MD    Allergies:  No Known Allergies  Social History:  Ambulatory  independently  Lives at  home   reports that he has quit smoking. He does not have any smokeless tobacco history on file. He reports that he  does not drink alcohol or use illicit drugs.   Family History: family history includes Heart disease in his mother.    Physical Exam: Patient Vitals for the past 24 hrs:  BP Temp Temp src Pulse Resp SpO2  12/22/11 0228 131/69 mmHg 99.1 F (37.3 C) Oral 96  16  97 %  12/22/11 0226 - - - 90  16  -    1. General:  in No Acute distress 2. Psychological: Alert and Oriented 3. Head/ENT:    Dry Mucous Membranes                          Head Non traumatic, neck supple                           Normal  Dentition 4. SKIN: decreased Skin turgor,  Skin clean Dry and intact no rash 5. Heart: rapid but Regular rate and rhythm no Murmur, Rub or gallop 6. Lungs: Clear to auscultation bilaterally, no wheezes or crackles   7. Abdomen: Soft, non-tender, Non distended 8. Lower extremities: no clubbing, cyanosis, or edema 9. Neurologically Grossly intact, moving all 4 extremities equally 10. MSK: Normal range of motion  body mass index is unknown because there is no height or weight on file.   Labs on Admission:   [facility] 12/22/11 0410  NA 137  K 4.3  CL 101  CO2 27  GLUCOSE 144*  BUN 16  CREATININE 0.89  CALCIUM 9.4  MG --  PHOS --   No results found for this basename: AST:2,ALT:2,ALKPHOS:2,BILITOT:2,PROT:2,ALBUMIN:2 in the last 72 hours No results found for this basename: LIPASE:2,AMYLASE:2 in the last 72 hours  Basename 12/22/11 0410  WBC 12.0*  NEUTROABS 10.9*  HGB 13.3  HCT 39.4  MCV 94.5  PLT 192   No results found for this basename: CKTOTAL:3,CKMB:3,CKMBINDEX:3,TROPONINI:3 in the last 72 hours No results found for this basename: TSH,T4TOTAL,FREET3,T3FREE,THYROIDAB in the last 72 hours No results found for this basename: VITAMINB12:2,FOLATE:2,FERRITIN:2,TIBC:2,IRON:2,RETICCTPCT:2 in the last 72 hours No results found for this basename: HGBA1C    The CrCl is unknown because both a height and weight (above a minimum accepted value) are required for this calculation. ABG    Component Value Date/Time   PHART 7.427 03/20/2011 1938   HCO3 28.8* 03/20/2011 1938   TCO2 27 09/07/2011 2132   O2SAT 96.2 03/20/2011 1938     No results found for this basename: DDIMER     Other results:  I have pearsonaly reviewed this: ECG REPORT  Rate: 96  Rhythm: Sinus rhythm with right bundle branch block ST&T Change: No evidence of ischemia      Radiological Exams on Admission: Dg Chest 1 View  12/22/2011  *RADIOLOGY REPORT*  Clinical Data: Left hip fracture; preoperative  chest radiograph.  CHEST - 1 VIEW  Comparison: Chest radiograph performed 09/11/2011  Findings: There is persistent mild elevation of the left hemidiaphragm.  Mild bibasilar opacities likely reflect atelectasis.  Mild vascular congestion is seen, without significant pulmonary edema.  No pleural effusion or pneumothorax is seen.  The cardiomediastinal silhouette is normal in size.  Calcification is noted within the aortic arch.  No acute osseous abnormalities are identified.  IMPRESSION: Mild bibasilar opacities likely reflect atelectasis; mild vascular congestion, without significant pulmonary edema.  Original Report Authenticated By: Tonia Ghent, M.D.   Dg Elbow Complete Left  12/22/2011  *RADIOLOGY REPORT*  Clinical Data: Status post fall; laceration  to the olecranon region.  LEFT ELBOW - COMPLETE 3+ VIEW  Comparison: None.  Findings: There is no evidence of fracture or dislocation.  The visualized joint spaces are preserved.  No significant joint effusion is identified.  The known soft tissue laceration is difficult to fully characterize on radiograph.  IMPRESSION: No evidence of fracture or dislocation.  Original Report Authenticated By: Tonia Ghent, M.D.   Dg Hip Complete Left  12/22/2011  *RADIOLOGY REPORT*  Clinical Data: Status post fall; left hip pain.  LEFT HIP - COMPLETE 2+ VIEW  Comparison: None.  Findings: There is a comminuted subcapital fracture of the left femoral neck, with mild displacement of fragments.  The left femoral head remains seated at the acetabulum.  No additional fractures are seen.  Mild superior joint space narrowing is noted at the right hip; the right hip is otherwise grossly unremarkable in appearance.  The sacroiliac joints are unremarkable in appearance.  The visualized bowel gas pattern is grossly unremarkable in appearance.  IMPRESSION: Comminuted subcapital fracture of the left femoral neck, with mild displacement of fragments.  Original Report Authenticated By:  Tonia Ghent, M.D.    Assessment/Plan  78 year-old gentleman with mechanical fall resulting in left hip fracture admitted by medicine for father radiation treatment orthopedics to see in consult plan to operate this morning later on today.  Present on Admission:  .Hip fracture - given advanced age patient is in moderate risk but he has no recent history of chest pain he has been exercising on regular basis. I do not believe he requires her father cardiac evaluation prior to proceeding to OR. Would recommend avoiding fluid overload. We'll monitor for postoperative complications such as infection and confusion, also patient may have trouble urinary retention as he had had this in the past. Will need placement postoperatively for rehabilitation PT OT orders as per orthopedics  .HTN (hypertension) - resume medications postoperatively perioperatively he will likely benefit from low dose beta blocker,  History of depression - would resume his medications postoperatively  Prophylaxis: SCDProtonix  CODE STATUS: DNR /DNI As per wishes of his family   Other plan as per orders.  I have spent a total of 55 min on this admission.   Nayelis Bonito 12/22/2011, 5:24 AM

## 2011-12-23 DIAGNOSIS — I10 Essential (primary) hypertension: Secondary | ICD-10-CM

## 2011-12-23 DIAGNOSIS — F329 Major depressive disorder, single episode, unspecified: Secondary | ICD-10-CM

## 2011-12-23 DIAGNOSIS — S72009A Fracture of unspecified part of neck of unspecified femur, initial encounter for closed fracture: Secondary | ICD-10-CM

## 2011-12-23 LAB — BASIC METABOLIC PANEL
CO2: 26 mEq/L (ref 19–32)
Calcium: 8.4 mg/dL (ref 8.4–10.5)
Chloride: 100 mEq/L (ref 96–112)
Glucose, Bld: 110 mg/dL — ABNORMAL HIGH (ref 70–99)
Sodium: 136 mEq/L (ref 135–145)

## 2011-12-23 LAB — CBC
MCV: 94.9 fL (ref 78.0–100.0)
Platelets: 182 10*3/uL (ref 150–400)
RBC: 4.12 MIL/uL — ABNORMAL LOW (ref 4.22–5.81)
WBC: 12.3 10*3/uL — ABNORMAL HIGH (ref 4.0–10.5)

## 2011-12-23 MED ORDER — VENLAFAXINE HCL ER 75 MG PO CP24
225.0000 mg | ORAL_CAPSULE | Freq: Every day | ORAL | Status: DC
  Administered 2011-12-23 – 2011-12-25 (×2): 225 mg via ORAL
  Filled 2011-12-23 (×4): qty 1

## 2011-12-23 NOTE — Progress Notes (Signed)
    Subjective: 1 Day Post-Op Procedure(s) (LRB): ARTHROPLASTY BIPOLAR HIP (Left) Patient reports pain as 4 on 0-10 scale.   Denies CP or SOB.  Voiding without difficulty. Positive flatus. Objective: Vital signs in last 24 hours: Temp:  [96.8 F (36 C)-99 F (37.2 C)] 98.2 F (36.8 C) (06/22 0620) Pulse Rate:  [78-106] 96  (06/22 0620) Resp:  [14-22] 20  (06/22 0620) BP: (137-166)/(77-91) 166/91 mmHg (06/22 0620) SpO2:  [94 %-100 %] 97 % (06/22 0620) Weight:  [63.05 kg (139 lb)] 63.05 kg (139 lb) (06/21 0836)  Intake/Output from previous day: 06/21 0701 - 06/22 0700 In: 2251.7 [I.V.:2251.7] Out: 1725 [Urine:1575; Blood:150] Intake/Output this shift:    Labs:  Basename 12/23/11 0432 12/22/11 0410  HGB 13.0 13.3    Basename 12/23/11 0432 12/22/11 0410  WBC 12.3* 12.0*  RBC 4.12* 4.17*  HCT 39.1 39.4  PLT 182 192    Basename 12/23/11 0432 12/22/11 0410  NA 136 137  K 3.7 4.3  CL 100 101  CO2 26 27  BUN 11 16  CREATININE 0.73 0.89  GLUCOSE 110* 144*  CALCIUM 8.4 9.4    Basename 12/22/11 0410  LABPT --  INR 1.07    Physical Exam: Intact pulses distally Incision: dressing C/D/I Compartment soft  Assessment/Plan: 1 Day Post-Op Procedure(s) (LRB): ARTHROPLASTY BIPOLAR HIP (Left) Advance per hip fracture protocol  [place] for Dr. Venita Lick [facility] [phone] 12/23/2011, 8:23 AM

## 2011-12-23 NOTE — Progress Notes (Signed)
Physical Therapy Treatment Patient Details Name: Juan Diaz MRN: [id number] DOB: [birth year] Today's Date: 12/23/2011 Time: 4098-1191 PT Time Calculation (min): 31 min  PT Assessment / Plan / Recommendation Comments on Treatment Session  Pts chair to bed transfer somewhat improved from am transfer from bed.  Pt continues to have increased anxiety with all mobility and states that "I can't do it."  PT and wife continue to provide max encouragement.      Follow Up Recommendations  Skilled nursing facility    Barriers to Discharge        Equipment Recommendations  Defer to next venue    Recommendations for Other Services OT consult  Frequency 7X/week   Plan Discharge plan remains appropriate    Precautions / Restrictions Precautions Precautions: Posterior Hip Restrictions Weight Bearing Restrictions: Yes LLE Weight Bearing: Partial weight bearing LLE Partial Weight Bearing Percentage or Pounds: 50%   Pertinent Vitals/Pain No pain, just very anxious    Mobility  Bed Mobility Bed Mobility: Sit to Supine Sit to Supine: 1: +2 Total assist;HOB flat Sit to Supine: Patient Percentage: 20% Details for Bed Mobility Assistance: Requires assist for B LE into bed with assist for controlled descent of trunk into bed.  Cues for technique.  Transfers Transfers: Sit to Stand;Stand to Sit;Stand Pivot Transfers Sit to Stand: 1: +2 Total assist;With upper extremity assist;From chair/3-in-1;With armrests Sit to Stand: Patient Percentage: 40% Stand to Sit: 1: +2 Total assist;With upper extremity assist;To elevated surface;To bed Stand to Sit: Patient Percentage: 30% Stand Pivot Transfers: 1: +2 Total assist Stand Pivot Transfers: Patient Percentage: 50% Details for Transfer Assistance: Pt continues to require assist for forward/upward weight shift due to posterior leaning with verbal and manual cuing for upright posture and increased UE WB to decrease pain in hip.  Pt continues to be very  anxious with all mobility, however performed chair to bed transfer somewhat better than in am session.  Pt able to take a few improved steps.   Ambulation/Gait Ambulation/Gait Assistance: Not tested (comment)    Exercises Total Joint Exercises Ankle Circles/Pumps: AROM;Both;20 reps;Supine   PT Diagnosis:    PT Problem List:   PT Treatment Interventions:     PT Goals Acute Rehab PT Goals PT Goal Formulation: With family Time For Goal Achievement: 12/30/11 Potential to Achieve Goals: Fair Pt will go Sit to Supine/Side: with min assist PT Goal: Sit to Supine/Side - Progress: Progressing toward goal Pt will go Sit to Stand: with mod assist PT Goal: Sit to Stand - Progress: Progressing toward goal Pt will go Stand to Sit: with mod assist PT Goal: Stand to Sit - Progress: Progressing toward goal Pt will Transfer Bed to Chair/Chair to Bed: with mod assist PT Transfer Goal: Bed to Chair/Chair to Bed - Progress: Progressing toward goal  Visit Information  Last PT Received On: 12/23/11 Assistance Needed: +2    Subjective Data  Subjective: I can't do it Patient Stated Goal: n/a   Cognition  Overall Cognitive Status: Difficult to assess Difficult to assess due to: Level of arousal Arousal/Alertness: Lethargic (somewhat more alert) Orientation Level: Appears intact for tasks assessed Behavior During Session: Anxious    Balance     End of Session PT - End of Session Equipment Utilized During Treatment: Gait belt Activity Tolerance: Patient limited by fatigue (anxiety) Patient left: in bed;with call bell/phone within reach;with family/visitor present    Page, [name] 12/23/2011, 4:15 PM

## 2011-12-23 NOTE — Progress Notes (Signed)
Subjective: Patient seen and examined this afternoon. Appears sleepy. Wife at bedside who informs patient was awake and talking all day  Objective:  Vital signs in last 24 hours:  Filed Vitals:   12/23/11 0200 12/23/11 0400 12/23/11 0620 12/23/11 1428  BP: 152/80  166/91 144/78  Pulse: 94  96 103  Temp: 97.6 F (36.4 C)  98.2 F (36.8 C) 98.3 F (36.8 C)  TempSrc: Oral  Oral Oral  Resp: 20 20 20 18   Height:      Weight:      SpO2: 96% 94% 97% 93%    Intake/Output from previous day:   Intake/Output Summary (Last 24 hours) at 12/23/11 1504 Last data filed at 12/23/11 1300  Gross per 24 hour  Intake 2206.67 ml  Output   1400 ml  Net 806.67 ml    Physical Exam:  General: elderly male  in no acute distress.appears sleepy HEENT: no pallor, no icterus, moist oral mucosa, no JVD, no lymphadenopathy Heart: Normal  s1 &s2  Regular rate and rhythm, without murmurs, rubs, gallops. Lungs: Clear to auscultation bilaterally. Abdomen: Soft, nontender, nondistended, positive bowel sounds. Extremities: No clubbing cyanosis or edema with positive pedal pulses. Dressing over left hip intact Neuro: sleepy but arousal to command, nonfocal.   Lab Results:  Basic Metabolic Panel:    Component Value Date/Time   NA 136 12/23/2011 0432   K 3.7 12/23/2011 0432   CL 100 12/23/2011 0432   CO2 26 12/23/2011 0432   BUN 11 12/23/2011 0432   CREATININE 0.73 12/23/2011 0432   GLUCOSE 110* 12/23/2011 0432   CALCIUM 8.4 12/23/2011 0432   CBC:    Component Value Date/Time   WBC 12.3* 12/23/2011 0432   HGB 13.0 12/23/2011 0432   HCT 39.1 12/23/2011 0432   PLT 182 12/23/2011 0432   MCV 94.9 12/23/2011 0432   NEUTROABS 10.9* 12/22/2011 0410   LYMPHSABS 0.3* 12/22/2011 0410   MONOABS 0.8 12/22/2011 0410   EOSABS 0.0 12/22/2011 0410   BASOSABS 0.0 12/22/2011 0410    Recent Results (from the past 240 hour(s))  MRSA PCR SCREENING     Status: Normal   Collection Time   12/22/11 10:49 AM      Component  Value Range Status Comment   MRSA by PCR NEGATIVE  NEGATIVE Final     Studies/Results: Dg Chest 1 View  12/22/2011  *RADIOLOGY REPORT*  Clinical Data: Left hip fracture; preoperative chest radiograph.  CHEST - 1 VIEW  Comparison: Chest radiograph performed 09/11/2011  Findings: There is persistent mild elevation of the left hemidiaphragm.  Mild bibasilar opacities likely reflect atelectasis.  Mild vascular congestion is seen, without significant pulmonary edema.  No pleural effusion or pneumothorax is seen.  The cardiomediastinal silhouette is normal in size.  Calcification is noted within the aortic arch.  No acute osseous abnormalities are identified.  IMPRESSION: Mild bibasilar opacities likely reflect atelectasis; mild vascular congestion, without significant pulmonary edema.  Original Report Authenticated By: Tonia Ghent, M.D.   Dg Elbow Complete Left  12/22/2011  *RADIOLOGY REPORT*  Clinical Data: Status post fall; laceration to the olecranon region.  LEFT ELBOW - COMPLETE 3+ VIEW  Comparison: None.  Findings: There is no evidence of fracture or dislocation.  The visualized joint spaces are preserved.  No significant joint effusion is identified.  The known soft tissue laceration is difficult to fully characterize on radiograph.  IMPRESSION: No evidence of fracture or dislocation.  Original Report Authenticated By: Tonia Ghent, M.D.  Dg Hip Complete Left  12/22/2011  *RADIOLOGY REPORT*  Clinical Data: Status post fall; left hip pain.  LEFT HIP - COMPLETE 2+ VIEW  Comparison: None.  Findings: There is a comminuted subcapital fracture of the left femoral neck, with mild displacement of fragments.  The left femoral head remains seated at the acetabulum.  No additional fractures are seen.  Mild superior joint space narrowing is noted at the right hip; the right hip is otherwise grossly unremarkable in appearance.  The sacroiliac joints are unremarkable in appearance.  The visualized bowel gas  pattern is grossly unremarkable in appearance.  IMPRESSION: Comminuted subcapital fracture of the left femoral neck, with mild displacement of fragments.  Original Report Authenticated By: Tonia Ghent, M.D.   Dg Pelvis Portable  12/22/2011  *RADIOLOGY REPORT*  Clinical Data: History of left hip arthroplasty.  Postoperative status.  PORTABLE PELVIS  Comparison: 09/12/2011.  Findings: Left hip arthroplasty has been performed.  In the AP projection there is no evidence of dislocation.  There is expected relationship between the acetabular and femoral components.  The acetabular component appears seated in the acetabulum on the left. No disruption of hardware is seen.  There is osteopenic appearance of the bones.  There is hip degenerative spurring on the right. There is degenerative spondylosis.  IMPRESSION: The patient is postoperative.  Left hip arthroplasty has been performed.  No dislocation is seen.  There is expected relationship of hardware components.  Original Report Authenticated By: Crawford Givens, M.D.    Medications: Scheduled Meds:   . ARIPiprazole  2 mg Oral BID  . bacitracin  1 application Topical BID  .  ceFAZolin (ANCEF) IV  2 g Intravenous Once  .  ceFAZolin (ANCEF) IV  2 g Intravenous Q6H  . docusate sodium  100 mg Oral BID  . enoxaparin  30 mg Subcutaneous Q12H  . ferrous sulfate  325 mg Oral TID PC  . finasteride  5 mg Oral Daily  . metoprolol  2.5 mg Intravenous Q12H  . polyethylene glycol  17 g Oral Daily  . venlafaxine XR  225 mg Oral QPC supper  . DISCONTD: ARIPiprazole  2.5 mg Oral BID  . DISCONTD:  ceFAZolin (ANCEF) IV  2 g Intravenous Q6H  . DISCONTD: venlafaxine XR  225 mg Oral Daily   Continuous Infusions:   . sodium chloride Stopped (12/22/11 1445)  . sodium chloride 50 mL/hr at 12/23/11 0328  . DISCONTD: lactated ringers 1,000 mL (12/22/11 1445)   PRN Meds:.acetaminophen, acetaminophen, HYDROcodone-acetaminophen, HYDROmorphone (DILAUDID) injection,  menthol-cetylpyridinium, methocarbamol (ROBAXIN) IV, methocarbamol, metoCLOPramide (REGLAN) injection, metoCLOPramide, morphine injection, ondansetron (ZOFRAN) IV, ondansetron, phenol, promethazine, DISCONTD: 0.9 % irrigation (POUR BTL), DISCONTD: HYDROcodone-acetaminophen, DISCONTD: methocarbamol (ROBAXIN) IV, DISCONTD: methocarbamol DISCONTD:  morphine injection  Assessment/Plan:    *left Hip fracture S/p arthroplasty by ortho on 6/21 Stable post op  patient sleepy likely due to narcotic effect. Will minimize use and continue with robaxin unless needed Gentle hydration  bowel regimen PT/ OT DVT prophylaxis with sq lovenox bid  Active Problems:  HTN (hypertension) Continue home meds    Depression Resume home meds  Diet: low sodium  DNR  Dispo: likely SNF      LOS: 1 day   Rayanna Matusik 12/23/2011, 3:04 PM

## 2011-12-23 NOTE — Evaluation (Signed)
Physical Therapy Evaluation Patient Details Name: Juan Diaz MRN: [id number] DOB: 10/02/1932 Today's Date: 12/23/2011 Time: 4098-1191 PT Time Calculation (min): 38 min  PT Assessment / Plan / Recommendation Clinical Impression  Pt presents s/p bipolar hip arthroplasty after sustaining femur fracture from fall at home.  Wife states that he has been here before in February and went to [facility] then home where he fell again.  Pt required max encouragement from wife and therapist to get to chair.  Noted pt very anxious about moving due to previous falls.  Requires +2 assist to ensure safety and maintaining upright posture.  Pt will benefit from skilled PT in acute venue to address deficits.  PT recommends SNF for follow up therapy at D/C to increase pt safety and decrease burden of care.     PT Assessment  Patient needs continued PT services    Follow Up Recommendations  Skilled nursing facility    Barriers to Discharge Decreased caregiver support      lEquipment Recommendations  Defer to next venue    Recommendations for Other Services OT consult   Frequency 7X/week    Precautions / Restrictions Precautions Precautions: Posterior Hip Restrictions Weight Bearing Restrictions: Yes LLE Weight Bearing: Partial weight bearing LLE Partial Weight Bearing Percentage or Pounds: 50%   Pertinent Vitals/Pain Unable to state pain score, PAINAD score 6      Mobility  Bed Mobility Bed Mobility: Supine to Sit;Sitting - Scoot to Edge of Bed Supine to Sit: 1: +2 Total assist;HOB elevated Supine to Sit: Patient Percentage: 30% Sitting - Scoot to Edge of Bed: 1: +2 Total assist Sitting - Scoot to Edge of Bed: Patient Percentage: 30% Details for Bed Mobility Assistance: Requires assist for B LE off of bed and for trunk to attain sitting position.  Max cuing provided for UE technique to self assist and for pt to hold himself up in sitting.   Transfers Transfers: Sit to Stand;Stand to  Sit;Stand Pivot Transfers Sit to Stand: 1: +2 Total assist;From elevated surface;With upper extremity assist;From bed Sit to Stand: Patient Percentage: 40% Stand to Sit: 1: +2 Total assist;With upper extremity assist;With armrests;To chair/3-in-1 Stand to Sit: Patient Percentage: 30% Stand Pivot Transfers: 1: +2 Total assist Stand Pivot Transfers: Patient Percentage: 40% Details for Transfer Assistance: Requires assist for forward/upward weight shifting due to pt with heavy posterior lean, esp initially with max verbal and manual cuing for glute activation and upright posture.  Also provided cues for foot placement with assist to ensure feet maintained under pt.  Took some steps from bed to chair with assist for weight shifting and LE advancement with max verbal and manual cuing for  both. Noted pt VERY anxious about being up due to falls.  Ambulation/Gait Ambulation/Gait Assistance: Not tested (comment) Stairs: No Wheelchair Mobility Wheelchair Mobility: No    Exercises     PT Diagnosis: Difficulty walking;Abnormality of gait;Generalized weakness;Acute pain  PT Problem List: Decreased strength;Decreased range of motion;Decreased activity tolerance;Decreased balance;Decreased mobility;Decreased knowledge of use of DME;Decreased cognition;Decreased safety awareness;Decreased knowledge of precautions;Pain PT Treatment Interventions: DME instruction;Gait training;Functional mobility training;Therapeutic activities;Therapeutic exercise;Balance training;Patient/family education   PT Goals Acute Rehab PT Goals PT Goal Formulation: With family Time For Goal Achievement: 12/30/11 Potential to Achieve Goals: Fair Pt will go Supine/Side to Sit: with min assist PT Goal: Supine/Side to Sit - Progress: Goal set today Pt will go Sit to Supine/Side: with min assist PT Goal: Sit to Supine/Side - Progress: Goal set today Pt will go  Sit to Stand: with mod assist PT Goal: Sit to Stand - Progress: Goal set  today Pt will go Stand to Sit: with mod assist PT Goal: Stand to Sit - Progress: Goal set today Pt will Transfer Bed to Chair/Chair to Bed: with mod assist PT Transfer Goal: Bed to Chair/Chair to Bed - Progress: Goal set today Pt will Ambulate: 1 - 15 feet;with mod assist;with least restrictive assistive device PT Goal: Ambulate - Progress: Goal set today  Visit Information  Last PT Received On: 12/23/11 Assistance Needed: +2    Subjective Data  Subjective: I'm scared to death Patient Stated Goal: n/a   Prior Functioning  Home Living Lives With: Spouse Available Help at Discharge: Skilled Nursing Facility Type of Home: House Home Access: Stairs to enter Secretary/administrator of Steps: 3-4 Home Layout: One level Bathroom Shower/Tub: Engineer, manufacturing systems: Standard Home Adaptive Equipment: Straight cane;Walker - rolling Prior Function Level of Independence: Independent Able to Take Stairs?: Yes Driving: No Vocation: Retired Musician: Clinical cytogeneticist  Overall Cognitive Status: Difficult to assess Difficult to assess due to: Level of arousal Arousal/Alertness: Lethargic Orientation Level: Appears intact for tasks assessed Behavior During Session: Lethargic    Extremity/Trunk Assessment Right Lower Extremity Assessment RLE ROM/Strength/Tone: Deficits RLE ROM/Strength/Tone Deficits: all motions grossly at least 3/5 per functional assessment.  RLE Coordination: WFL - gross motor Left Lower Extremity Assessment LLE ROM/Strength/Tone: Unable to fully assess;Due to pain;Due to precautions;Due to impaired cognition LLE Coordination: WFL - gross motor Trunk Assessment Trunk Assessment: Kyphotic   Balance Balance Balance Assessed: Yes Static Sitting Balance Static Sitting - Balance Support: No upper extremity supported;Feet supported Static Sitting - Level of Assistance: 3: Mod assist;2: Max assist Static Sitting - Comment/# of Minutes:  Initially mod/max assist however transitioned to stand by assist with cuing for technique.   End of Session PT - End of Session Equipment Utilized During Treatment: Gait belt Activity Tolerance: Patient limited by fatigue;Other (comment) (limited by anxiety) Patient left: in chair;with call bell/phone within reach;with family/visitor present Nurse Communication: Mobility status   Page, [name] 12/23/2011, 10:43 AM

## 2011-12-23 NOTE — Op Note (Signed)
NAME:  Juan Diaz, HEIST NO.:  [id number]  MEDICAL RECORD NO.:  [id number]  LOCATION:  1603                         FACILITY:  [facility]  PHYSICIAN:  Almedia Balls. Ranell Patrick, M.D. DATE OF BIRTH:  25-Oct-1932  DATE OF PROCEDURE:  12/22/2011 DATE OF DISCHARGE:                              OPERATIVE REPORT   PREOPERATIVE DIAGNOSIS:  Left displaced femoral neck fracture.  POSTOPERATIVE DIAGNOSIS:  Left displaced femoral neck fracture.  PROCEDURE PERFORMED:  Left hip hemiarthroplasty using DePuy Tri-Lock system with a monopolar head.  ATTENDING SURGEON:  Almedia Balls. Ranell Patrick, MD.  ASSISTANT:  Donnie Coffin. Dixon, PA-C was scrubbed in the entire procedure and necessary for satisfactory completion of procedure.  ANESTHESIA:  General anesthesia was used.  ESTIMATED BLOOD LOSS:  200 mL.  FLUID REPLACEMENT:  1500 mL of crystalloid.  INSTRUMENT COUNTS:  Correct.  COMPLICATIONS:  There were no complications.  Perioperative antibiotics were given.  INDICATIONS:  The patient is a 78 year old male, status post fall injuring left hip.  The patient unable to ambulate after the fall.  X- rays demonstrating a displaced femoral neck fracture.  Counseled the patient and his family regarding the need for operative hemiarthroplasty to restore proximal femoral anatomy.  The patient and his wife agreed. Informed consent obtained.  DESCRIPTION OF PROCEDURE:  After an adequate level of anesthesia achieved, the patient was positioned in right lateral decubitus position with the left hip up.  Left hip sterilely prepped and draped in the usual manner and correctly identified, down leg padded appropriately. Time-out was called.  We then performed a posterior Kocher-Langenbeck incision starting at the vastus ridge extending posteriorly along the gluteus maximus.  Dissection down through subcutaneous tissue.  Tensor fascia lata was split in line with its fibers.  The hip was internally rotated.   Gluteus medius retracted.  The short external rotators were taken off the posterior femur exposing the broken hip.  We then resected the femoral neck 1 fingerbreadth above the lesser trochanter using neck resection guide and oscillating saw.  We then went ahead and delivered the femoral head out of the wound.  We sized up to a size 47.  Next, we irrigated and cleaned out the acetabulum.  No sign of arthritis was noted.  We placed retention sutures in the posterior capsule.  We then went ahead and prepared the femur with sequential broaching for the Tri- Lock system up to a size 7.  With the broach in place, we used the -3 neck and with the 47 monopolar trial reduced the hip.  We ranged the hip fully with no instability and proper leg lengths.  We retrieved the trial components, thoroughly irrigated the femur, and then impacted the real size 7 Tri-Lock with appropriate anteversion into position.  Once secured, we went ahead and impacted the -3 Morse taper neck adapter into the head and then impacted that onto the trunnion.  We reduced the hip. After suctioning the socket, we were pleased again with soft tissue balance and stability throughout a full range of motion.  We thoroughly irrigated the wounds.  We then closed the posterior capsule with interrupted #1 Vicryl figure-of-eight suture followed by repair of the piriformis to the gluteus medius  and greater trochanter.  Next, we went ahead and repaired the tensor fascia lata with #1 Vicryl suture figure- of-eight for the distal extent in the tendon and then around the more proximal portion with #1 Vicryl, 2-0 Vicryl for subcutaneous closure, and 4-0 Monocryl for skin, and a Mepilex dressing applied.  The patient tolerated the procedure well.     Almedia Balls. Ranell Patrick, M.D.     SRN/MEDQ  D:  12/22/2011  T:  12/23/2011  Job:  [id number]

## 2011-12-24 ENCOUNTER — Inpatient Hospital Stay (HOSPITAL_COMMUNITY): Payer: Medicare Other

## 2011-12-24 DIAGNOSIS — I10 Essential (primary) hypertension: Secondary | ICD-10-CM

## 2011-12-24 DIAGNOSIS — E782 Mixed hyperlipidemia: Secondary | ICD-10-CM

## 2011-12-24 DIAGNOSIS — R4182 Altered mental status, unspecified: Secondary | ICD-10-CM

## 2011-12-24 DIAGNOSIS — S72009A Fracture of unspecified part of neck of unspecified femur, initial encounter for closed fracture: Secondary | ICD-10-CM

## 2011-12-24 LAB — CBC
MCV: 94.3 fL (ref 78.0–100.0)
Platelets: 147 10*3/uL — ABNORMAL LOW (ref 150–400)
RDW: 12 % (ref 11.5–15.5)
WBC: 10.7 10*3/uL — ABNORMAL HIGH (ref 4.0–10.5)

## 2011-12-24 LAB — BASIC METABOLIC PANEL
Calcium: 8.7 mg/dL (ref 8.4–10.5)
Chloride: 101 mEq/L (ref 96–112)
Creatinine, Ser: 0.74 mg/dL (ref 0.50–1.35)
GFR calc Af Amer: >90 mL/min (ref >90)
Sodium: 136 mEq/L (ref 135–145)

## 2011-12-24 MED ORDER — LEVOFLOXACIN IN D5W 500 MG/100ML IV SOLN
500.0000 mg | INTRAVENOUS | Status: DC
  Administered 2011-12-24 – 2011-12-25 (×2): 500 mg via INTRAVENOUS
  Filled 2011-12-24 (×4): qty 100

## 2011-12-24 NOTE — Progress Notes (Signed)
Physical Therapy Treatment Patient Details Name: Juan Diaz MRN: [id number] DOB: 05-Oct-1932 Today's Date: 12/24/2011 Time: 4098-1191 PT Time Calculation (min): 19 min  PT Assessment / Plan / Recommendation Comments on Treatment Session  Pt with improved mobiltiy during am session and able to take some steps from the bed.  Continues to require +2 assist for upright posture and mobility.     Follow Up Recommendations  Skilled nursing facility    Barriers to Discharge        Equipment Recommendations  Defer to next venue    Recommendations for Other Services    Frequency 7X/week   Plan Discharge plan remains appropriate    Precautions / Restrictions Precautions Precautions: Posterior Hip Precaution Booklet Issued: Yes (comment) Precaution Comments: Educated pt/wife on post hip prec.  Restrictions Weight Bearing Restrictions: Yes LLE Weight Bearing: Partial weight bearing LLE Partial Weight Bearing Percentage or Pounds: 50   Pertinent Vitals/Pain 2/10    Mobility  Bed Mobility Bed Mobility: Supine to Sit;Sitting - Scoot to Edge of Bed Supine to Sit: 1: +2 Total assist;HOB elevated Supine to Sit: Patient Percentage: 30% Sitting - Scoot to Edge of Bed: 1: +2 Total assist Sitting - Scoot to Edge of Bed: Patient Percentage: 30% Details for Bed Mobility Assistance: Requires assist for B LE off of bed and for trunk to attain sitting position. Max cuing provided for UE technique to self assist and for pt to hold himself up in sitting.  Transfers Transfers: Sit to Stand;Stand to Sit;Stand Pivot Transfers Sit to Stand: 1: +2 Total assist;With upper extremity assist;From bed;From elevated surface Sit to Stand: Patient Percentage: 40% Stand to Sit: 1: +2 Total assist;With upper extremity assist;To chair/3-in-1;With armrests Stand to Sit: Patient Percentage: 40% Details for Transfer Assistance: Pt continues to require assist for forward/upward weight shift due to posterior  leaning with verbal and manual cuing for upright posture and increased UE WB to decrease pain in hip. Pt continues to be very anxious with all mobility, however able to take some steps today Ambulation/Gait Ambulation/Gait Assistance: 1: +2 Total assist Ambulation/Gait: Patient Percentage: 40% Ambulation Distance (Feet): 5 Feet Assistive device: Rolling walker Ambulation/Gait Assistance Details: Cues and assist for weight shifting and LE advancement.  Also provided cuing to maintain WB status.  Gait Pattern: Step-to pattern;Antalgic;Trunk flexed;Narrow base of support;Scissoring;Decreased stride length    Exercises     PT Diagnosis:    PT Problem List:   PT Treatment Interventions:     PT Goals Acute Rehab PT Goals PT Goal Formulation: With family Time For Goal Achievement: 12/30/11 Potential to Achieve Goals: Fair Pt will go Supine/Side to Sit: with min assist PT Goal: Supine/Side to Sit - Progress: Progressing toward goal Pt will go Sit to Stand: with mod assist PT Goal: Sit to Stand - Progress: Progressing toward goal Pt will go Stand to Sit: with mod assist PT Goal: Stand to Sit - Progress: Progressing toward goal Pt will Transfer Bed to Chair/Chair to Bed: with mod assist PT Transfer Goal: Bed to Chair/Chair to Bed - Progress: Progressing toward goal Pt will Ambulate: 1 - 15 feet;with mod assist;with least restrictive assistive device PT Goal: Ambulate - Progress: Progressing toward goal  Visit Information  Last PT Received On: 12/24/11 Assistance Needed: +2    Subjective Data  Subjective: I'm not going to get up Patient Stated Goal: n/a   Cognition  Overall Cognitive Status: Appears within functional limits for tasks assessed/performed Arousal/Alertness: Lethargic Orientation Level: Appears intact for tasks assessed  Behavior During Session: Anxious    Balance     End of Session PT - End of Session Equipment Utilized During Treatment: Gait belt Activity  Tolerance: Patient limited by fatigue (anxiety) Patient left: in chair;with call bell/phone within reach;with family/visitor present Nurse Communication: Mobility status    Page, [name] 12/24/2011, 9:56 AM

## 2011-12-24 NOTE — Progress Notes (Signed)
Noted pt needing significant assist with PT today. Will attempt OT eval next day as appropriate to assess ADL activity. Lise Auer, OT

## 2011-12-24 NOTE — Progress Notes (Signed)
Subjective: Patient is more awake but very confused  And disoriented. Had a temp spike of 101.4 overnight. UA from admission suggests UTI. Hip pain controlled with current meds.   Objective:  Vital signs in last 24 hours:  Filed Vitals:   12/23/11 2304 12/24/11 0514 12/24/11 0800 12/24/11 1004  BP:  135/79  116/73  Pulse:  94  89  Temp: 98.9 F (37.2 C) 98.7 F (37.1 C)    TempSrc:  Axillary    Resp:  18 18   Height:      Weight:      SpO2:  94% 95% 97%    Intake/Output from previous day:   Intake/Output Summary (Last 24 hours) at 12/24/11 1118 Last data filed at 12/24/11 0929  Gross per 24 hour  Intake   1725 ml  Output      0 ml  Net   1725 ml    Physical Exam:  General: elderly male in no acute distress.appears sleepy  HEENT: no pallor, no icterus, moist oral mucosa, no JVD, no lymphadenopathy  Heart: Normal s1 &s2 Regular rate and rhythm, without murmurs, rubs, gallops.  Lungs: Clear to auscultation bilaterally.  Abdomen: Soft, nontender, nondistended, positive bowel sounds.  Extremities: No clubbing cyanosis or edema with positive pedal pulses. Dressing over left hip intact Neuro: sleepy but arousable. AAOX1 nonfocal.   Lab Results:  Basic Metabolic Panel:    Component Value Date/Time   NA 136 12/24/2011 0448   K 3.5 12/24/2011 0448   CL 101 12/24/2011 0448   CO2 26 12/24/2011 0448   BUN 11 12/24/2011 0448   CREATININE 0.74 12/24/2011 0448   GLUCOSE 106* 12/24/2011 0448   CALCIUM 8.7 12/24/2011 0448   CBC:    Component Value Date/Time   WBC 10.7* 12/24/2011 0448   HGB 12.9* 12/24/2011 0448   HCT 38.2* 12/24/2011 0448   PLT 147* 12/24/2011 0448   MCV 94.3 12/24/2011 0448   NEUTROABS 10.9* 12/22/2011 0410   LYMPHSABS 0.3* 12/22/2011 0410   MONOABS 0.8 12/22/2011 0410   EOSABS 0.0 12/22/2011 0410   BASOSABS 0.0 12/22/2011 0410    Recent Results (from the past 240 hour(s))  MRSA PCR SCREENING     Status: Normal   Collection Time   12/22/11 10:49 AM   Component Value Range Status Comment   MRSA by PCR NEGATIVE  NEGATIVE Final     Studies/Results: Dg Pelvis Portable  12/22/2011  *RADIOLOGY REPORT*  Clinical Data: History of left hip arthroplasty.  Postoperative status.  PORTABLE PELVIS  Comparison: 09/12/2011.  Findings: Left hip arthroplasty has been performed.  In the AP projection there is no evidence of dislocation.  There is expected relationship between the acetabular and femoral components.  The acetabular component appears seated in the acetabulum on the left. No disruption of hardware is seen.  There is osteopenic appearance of the bones.  There is hip degenerative spurring on the right. There is degenerative spondylosis.  IMPRESSION: The patient is postoperative.  Left hip arthroplasty has been performed.  No dislocation is seen.  There is expected relationship of hardware components.  Original Report Authenticated By: Crawford Givens, M.D.   Dg Chest Port 1 View  12/24/2011  *RADIOLOGY REPORT*  Clinical Data: Fever.  Chills.  PORTABLE CHEST - 1 VIEW 12/24/2011 0931 hours:  Comparison: Portable chest x-ray 12/22/2011, 07/27/2010.  Two-view chest x-ray 09/11/2011, 06/24/2009.  Findings: Suboptimal inspiration which accounts for atelectasis at the bases and accentuates the cardiac silhouette.  Aeration has improved  since examination 2 days ago.  No new pulmonary parenchymal abnormalities.  Vague nodular opacity at the left lung base is related to a healing left posterior 9th rib fracture. Cardiac silhouette normal in size for the AP portable technique, unchanged.  Pulmonary vascularity normal.  IMPRESSION: Suboptimal inspiration accounts for mild bibasilar atelectasis, though aeration has improved since examination 2 days ago.  No acute cardiopulmonary disease otherwise.  Original Report Authenticated By: Arnell Sieving, M.D.    Medications: Scheduled Meds:   . ARIPiprazole  2 mg Oral BID  . bacitracin  1 application Topical BID  .   ceFAZolin (ANCEF) IV  2 g Intravenous Once  . docusate sodium  100 mg Oral BID  . enoxaparin  30 mg Subcutaneous Q12H  . ferrous sulfate  325 mg Oral TID PC  . finasteride  5 mg Oral Daily  . levofloxacin (LEVAQUIN) IV  500 mg Intravenous Q24H  . metoprolol  2.5 mg Intravenous Q12H  . polyethylene glycol  17 g Oral Daily  . venlafaxine XR  225 mg Oral QPC supper  . DISCONTD: venlafaxine XR  225 mg Oral Daily   Continuous Infusions:   . sodium chloride 50 mL/hr at 12/23/11 2205   PRN Meds:.acetaminophen, acetaminophen, HYDROcodone-acetaminophen, menthol-cetylpyridinium, methocarbamol (ROBAXIN) IV, methocarbamol, metoCLOPramide (REGLAN) injection, metoCLOPramide, morphine injection, ondansetron (ZOFRAN) IV, ondansetron, phenol, promethazine, DISCONTD:  HYDROmorphone (DILAUDID) injection    Assessment/Plan:   *left Hip fracture  S/p arthroplasty by ortho on 6/21  Stable post op  patient sleepy likely due to UTI and  narcotic effect. Will minimize use and continue with robaxin unless needed  Gentle hydration  bowel regimen , will add senna and colace PT/ OT  DVT prophylaxis with sq lovenox bid   Fever with AMS Possibly related to UTI  cx sent  started on IV levaquin 500 mg daily from today  blood cx sent and will follow  CXR ordered  HTN (hypertension)  Continue home meds   Depression  Resume home meds   Diet: low sodium   DNR   Dispo: likely SNF once feer and confusion improves      LOS: 2 days   Kalana Yust 12/24/2011, 11:18 AM

## 2011-12-24 NOTE — Progress Notes (Signed)
CSW meet with the Pt and son to discuss SNF placement.  Pt does not want SNF or HH at this time, however Nurse stated that Pt has been confused.   CSW also spoke with wife via phone and wife stated same as nurse and that she would like to pursue placement.   CSW left SNF list in room with son and Pt.   CSW will reassess.   Leron Croak, LCSWA [employer] [phone]

## 2011-12-24 NOTE — Progress Notes (Signed)
Patient scheduled IV abx on day shift.  Day RN unable to obtain urine specimen using urinal, condom cath, and one attempt at [facility] cath also unsuccessful. Benedetto Coons, PA notified.  Order received to hang antibiotic and attempt I&O cath once more. Will monitor patient closely and proceed with orders received. 12/24/2011 Juan Diaz Lynder Parents, RN 8:30 PM

## 2011-12-24 NOTE — Progress Notes (Signed)
Juan Diaz  MRN: [id number] DOB/Age: 1933/06/19 78 y.o. Physician: Jacquelyne Balint Procedure: Procedure(s) (LRB): ARTHROPLASTY BIPOLAR HIP (Left)     Subjective: Up out of bed working with PT. Very frail, requiring 2 for assist. But making some progress.  Vital Signs Temp:  [97.5 F (36.4 C)-101.4 F (38.6 C)] 98.7 F (37.1 C) (06/23 0514) Pulse Rate:  [94-110] 94  (06/23 0514) Resp:  [15-18] 18  (06/23 0800) BP: (135-147)/(78-84) 135/79 mmHg (06/23 0514) SpO2:  [93 %-95 %] 95 % (06/23 0800)  Lab Results  Basename 12/24/11 0448 12/23/11 0432  WBC 10.7* 12.3*  HGB 12.9* 13.0  HCT 38.2* 39.1  PLT 147* 182   BMET  Basename 12/24/11 0448 12/23/11 0432  NA 136 136  K 3.5 3.7  CL 101 100  CO2 26 26  GLUCOSE 106* 110*  BUN 11 11  CREATININE 0.74 0.73  CALCIUM 8.7 8.4   INR  Date Value Range Status  12/22/2011 1.07  0.00 - 1.49 Final     Exam Left hip dressing with scant bloody drainage only. Left hip soft. NVI         Plan Cont PT/OT Has been previous resident at [facility], will likely need return there.  [name] for Dr.Kevin Supple 12/24/2011, 8:55 AM

## 2011-12-24 NOTE — Progress Notes (Signed)
Physical Therapy Treatment Patient Details Name: Juan Diaz MRN: [id number] DOB: 1933/05/20 Today's Date: 12/24/2011 Time: 8295-6213 PT Time Calculation (min): 20 min  PT Assessment / Plan / Recommendation Comments on Treatment Session  Pt with extreme anxiety in transferring back to bed from chair in pm session.  Requires MAX verbal and manual assist to prevent  backwards fall.      Follow Up Recommendations  Skilled nursing facility    Barriers to Discharge        Equipment Recommendations  Defer to next venue    Recommendations for Other Services    Frequency 7X/week   Plan Discharge plan remains appropriate    Precautions / Restrictions Precautions Precautions: Posterior Hip Precaution Booklet Issued: Yes (comment) Precaution Comments: Continue to provide education Restrictions Weight Bearing Restrictions: Yes LLE Weight Bearing: Partial weight bearing LLE Partial Weight Bearing Percentage or Pounds: 50   Pertinent Vitals/Pain Not much pain, not progressing with goals due to increased anxiety.     Mobility  Bed Mobility Bed Mobility: Sit to Supine Sit to Supine: 1: +2 Total assist;HOB flat Sit to Supine: Patient Percentage: 20% Details for Bed Mobility Assistance: Requires assist for B LE into bed with assist for controlled descent of trunk into bed. Cues for technique.  Transfers Transfers: Sit to Stand;Stand to Sit;Stand Pivot Transfers Sit to Stand: 1: +2 Total assist;From chair/3-in-1;With armrests;With upper extremity assist Sit to Stand: Patient Percentage: 40% Stand to Sit: 1: +2 Total assist;With upper extremity assist;To bed;To elevated surface Stand to Sit: Patient Percentage: 10% Stand Pivot Transfers: 1: +2 Total assist Stand Pivot Transfers: Patient Percentage: 20% Details for Transfer Assistance: Pt VERY anxious about getting back to bed and requires +2 assist to prevent backward fall.  Pt with severe posterior lean in standing with weight on  heels with MAX verbal and manual cuing for upright posture, forward weight shift to entire foot, weight shifting to step backwards.      Exercises     PT Diagnosis:    PT Problem List:   PT Treatment Interventions:     PT Goals Acute Rehab PT Goals PT Goal Formulation: With family Time For Goal Achievement: 12/30/11 Potential to Achieve Goals: Fair Pt will go Sit to Supine/Side: with min assist PT Goal: Sit to Supine/Side - Progress: Not progressing Pt will go Sit to Stand: with mod assist PT Goal: Sit to Stand - Progress: Not progressing Pt will go Stand to Sit: with mod assist PT Goal: Stand to Sit - Progress: Not progressing Pt will Transfer Bed to Chair/Chair to Bed: with mod assist PT Transfer Goal: Bed to Chair/Chair to Bed - Progress: Not progressing  Visit Information  Last PT Received On: 12/24/11 Assistance Needed: +2    Subjective Data  Subjective: I'm ready to eat Patient Stated Goal: n/a   Cognition  Overall Cognitive Status: Impaired Area of Impairment: Safety/judgement;Awareness of errors;Awareness of deficits;Following commands Arousal/Alertness: Awake/alert Orientation Level: Appears intact for tasks assessed Behavior During Session: Anxious Following Commands: Follows one step commands inconsistently Safety/Judgement: Decreased awareness of safety precautions;Decreased safety judgement for tasks assessed Awareness of Errors: Assistance required to identify errors made;Assistance required to correct errors made Awareness of Deficits: Pt with severe posterior lean with weight on heels when in standing.  Provide max verbal and manual cuing to correct.      Balance     End of Session PT - End of Session Equipment Utilized During Treatment: Gait belt Activity Tolerance: Patient limited by fatigue (anxiety)  Patient left: in bed;with call bell/phone within reach;with family/visitor present    Page, [name] 12/24/2011, 2:35 PM

## 2011-12-24 NOTE — Progress Notes (Signed)
Clinical Social Work Department BRIEF PSYCHOSOCIAL ASSESSMENT 12/24/2011  Patient:  Juan Diaz, Juan Diaz     Account Number:  [id number]     Admit date:  12/22/2011  Clinical Social Worker:  Leron Croak, CLINICAL SOCIAL WORKER  Date/Time:  12/24/2011 05:09 PM  Referred by:  Physician  Date Referred:  12/24/2011 Referred for  SNF Placement   Other Referral:   Interview type:  Patient Other interview type:   Family/wife    PSYCHOSOCIAL DATA Living Status:  FAMILY Admitted from facility:   Level of care:   Primary support name:  Valinda Hoar Primary support relationship to patient:  SPOUSE Degree of support available:   good    CURRENT CONCERNS Current Concerns  Post-Acute Placement   Other Concerns:   Wife would like placement.    SOCIAL WORK ASSESSMENT / PLAN CSW met with the Pt and son. The Pt was stating that he did not want placement. CSW spoke with the nurse who stated that Pt has been very disoriented. CSW then spoke with wife via phone. Wife does want placement. CSW proceed with sending FL2 per wife's wishes.   Assessment/plan status:  Information/Referral to [facility] Other assessment/ plan:   Information/referral to community resources:   CSW provided list of facilities in [place]    PATIENT'S/FAMILY'S RESPONSE TO PLAN OF CARE: Pt wife was appreciative but wants to speak with the CSW concerning placement.        Leron Croak, LCSWA [employer] Coverage [phone]

## 2011-12-24 NOTE — Progress Notes (Signed)
Clinical Social Work Department CLINICAL SOCIAL WORK PLACEMENT NOTE 12/24/2011  Patient:  Juan Diaz, Juan Diaz  Account Number:  [id number] Admit date:  12/22/2011  Clinical Social Worker:  Leron Croak, CLINICAL SOCIAL WORKER  Date/time:  12/24/2011 05:15 PM  Clinical Social Work is seeking post-discharge placement for this patient at the following level of care:   SKILLED NURSING   (*CSW will update this form in Epic as items are completed)   12/24/2011  Patient/family provided with [facility] Department of Clinical Social Work's list of facilities offering this level of care within the geographic area requested by the patient (or if unable, by the patient's family).  12/24/2011  Patient/family informed of their freedom to choose among providers that offer the needed level of care, that participate in Medicare, Medicaid or managed care program needed by the patient, have an available bed and are willing to accept the patient.  12/24/2011  Patient/family informed of [facility]' ownership interest in [facility], as well as of the fact that they are under no obligation to receive care at this facility.  PASARR submitted to EDS on  PASARR number received from EDS on   FL2 transmitted to all facilities in geographic area requested by pt/family on  12/24/2011 FL2 transmitted to all facilities within larger geographic area on 12/24/2011  Patient informed that his/her managed care company has contracts with or will negotiate with  certain facilities, including the following:     Patient/family informed of bed offers received:   Patient chooses bed at  Physician recommends and patient chooses bed at    Patient to be transferred to  on   Patient to be transferred to facility by   The following physician request were entered in Epic:   Additional Comments:  Leron Croak, Leeroy [employer] [employer] Coverage [phone]

## 2011-12-25 DIAGNOSIS — I10 Essential (primary) hypertension: Secondary | ICD-10-CM

## 2011-12-25 DIAGNOSIS — E782 Mixed hyperlipidemia: Secondary | ICD-10-CM

## 2011-12-25 DIAGNOSIS — R4182 Altered mental status, unspecified: Secondary | ICD-10-CM

## 2011-12-25 DIAGNOSIS — S72009A Fracture of unspecified part of neck of unspecified femur, initial encounter for closed fracture: Secondary | ICD-10-CM

## 2011-12-25 LAB — CBC
Platelets: 160 10*3/uL (ref 150–400)
RBC: 3.75 MIL/uL — ABNORMAL LOW (ref 4.22–5.81)
WBC: 8.9 10*3/uL (ref 4.0–10.5)

## 2011-12-25 NOTE — Progress Notes (Signed)
Orthopedics Progress Note  Subjective: Pt states he  Is feeling a little better today Asking when he can leave Minimal pain to left hip  Objective:  Filed Vitals:   12/25/11 0500  BP: 146/76  Pulse: 104  Temp: 98.8 F (37.1 C)  Resp: 16    General: Awake and alert  Musculoskeletal: left hip incision healing well, nv intact distally Neurovascularly intact  Lab Results  Component Value Date   WBC 8.9 12/25/2011   HGB 11.9* 12/25/2011   HCT 35.3* 12/25/2011   MCV 94.1 12/25/2011   PLT 160 12/25/2011       Component Value Date/Time   NA 136 12/24/2011 0448   K 3.5 12/24/2011 0448   CL 101 12/24/2011 0448   CO2 26 12/24/2011 0448   GLUCOSE 106* 12/24/2011 0448   BUN 11 12/24/2011 0448   CREATININE 0.74 12/24/2011 0448   CALCIUM 8.7 12/24/2011 0448   GFRNONAA 87* 12/24/2011 0448   GFRAA >90 12/24/2011 0448    Lab Results  Component Value Date   INR 1.07 12/22/2011   INR 1.04 03/20/2011    Assessment/Plan: POD #3 s/p Procedure(s):left ARTHROPLASTY BIPOLAR HIP  Pt stable for discharge to snf from orthopedic standpoint PT/OT Weight bearing as tolerated F/u in 2 weeks with Dr. Malon Kindle R. Ranell Patrick, MD 12/25/2011 7:30 AM

## 2011-12-25 NOTE — Progress Notes (Signed)
Physical Therapy Treatment Patient Details Name: Juan Diaz MRN: [id number] DOB: [birth year] Today's Date: 12/25/2011 Time: 4098-1191 PT Time Calculation (min): 21 min  PT Assessment / Plan / Recommendation Comments on Treatment Session  Pt much more alert today and talkative and states that he is ready to get up and walk.  Continues to require +2 assist for safety and LE advancement/RW sequencing when moving, however it is improved.     Follow Up Recommendations  Skilled nursing facility    Barriers to Discharge        Equipment Recommendations  Defer to next venue    Recommendations for Other Services    Frequency 7X/week   Plan Discharge plan remains appropriate    Precautions / Restrictions Precautions Precautions: Posterior Hip Restrictions Weight Bearing Restrictions: Yes LLE Weight Bearing: Partial weight bearing LLE Partial Weight Bearing Percentage or Pounds: 50   Pertinent Vitals/Pain 6/10 with movement    Mobility  Bed Mobility Bed Mobility: Supine to Sit;Sitting - Scoot to Edge of Bed Supine to Sit: 1: +2 Total assist;HOB elevated Supine to Sit: Patient Percentage: 30% Sitting - Scoot to Edge of Bed: 1: +2 Total assist Sitting - Scoot to Edge of Bed: Patient Percentage: 30% Details for Bed Mobility Assistance: Continues to require assist for B LE off of bed and for trunk to sit on EOB.  Pt did a little more to initiate UE placement, however still requires cues to assist.  Transfers Transfers: Sit to Stand;Stand to Sit Sit to Stand: 1: +2 Total assist;With upper extremity assist;From bed;From elevated surface Sit to Stand: Patient Percentage: 60% Stand to Sit: 1: +2 Total assist;With upper extremity assist;With armrests;To chair/3-in-1 Stand to Sit: Patient Percentage: 60% Details for Transfer Assistance: Pt MUCH improved this am compared to yesterday pm session.  More alert and talkative and states he is ready to get up.  Cues for proper hand placement  and to maintain hip precautions as standing.   Ambulation/Gait Ambulation/Gait Assistance: 1: +2 Total assist Ambulation/Gait: Patient Percentage: 60% Ambulation Distance (Feet): 12 Feet Assistive device: Rolling walker Ambulation/Gait Assistance Details: Pt doing MUCH better with maintaining upright position when standing.  Continues to require max verbal and manual cuing for correct sequencing/technique with LE.   Gait Pattern: Step-to pattern;Antalgic;Trunk flexed;Narrow base of support;Scissoring;Decreased stride length    Exercises     PT Diagnosis:    PT Problem List:   PT Treatment Interventions:     PT Goals Acute Rehab PT Goals PT Goal Formulation: With family Time For Goal Achievement: 12/30/11 Potential to Achieve Goals: Fair Pt will go Supine/Side to Sit: with min assist PT Goal: Supine/Side to Sit - Progress: Progressing toward goal Pt will go Sit to Stand: with mod assist PT Goal: Sit to Stand - Progress: Progressing toward goal Pt will go Stand to Sit: with mod assist PT Goal: Stand to Sit - Progress: Progressing toward goal Pt will Transfer Bed to Chair/Chair to Bed: with mod assist PT Transfer Goal: Bed to Chair/Chair to Bed - Progress: Progressing toward goal Pt will Ambulate: 1 - 15 feet;with mod assist;with least restrictive assistive device PT Goal: Ambulate - Progress: Progressing toward goal  Visit Information  Last PT Received On: 12/25/11 Assistance Needed: +2    Subjective Data  Subjective: I'm in a different fram of mind today Patient Stated Goal: n/a   Cognition  Overall Cognitive Status: Appears within functional limits for tasks assessed/performed Arousal/Alertness: Awake/alert Orientation Level: Appears intact for tasks assessed Behavior During Session:  Anxious (but improved)    Balance     End of Session PT - End of Session Equipment Utilized During Treatment: Gait belt Activity Tolerance: Patient tolerated treatment well;Patient  limited by fatigue Patient left: in chair;with call bell/phone within reach;with family/visitor present Nurse Communication: Mobility status    Page, [name] 12/25/2011, 10:03 AM

## 2011-12-25 NOTE — Progress Notes (Signed)
FL2 in shadow chart for MD signature. CSW assisting with d/c planning. Pt/family requesting [facility] SNF placement at [facility]. Will know later this am if bed is available. CSW will provide list of bed offers if [facility] is unable to assist. [facility] contacted to request prior approval for SNF. Awaiting response. Will follow to assist with d/c planning to SNF.  Cori Razor LCSW [phone]

## 2011-12-25 NOTE — Progress Notes (Signed)
Physical Therapy Treatment Patient Details Name: Juan Diaz MRN: [id number] DOB: 13-May-1933 Today's Date: 12/25/2011 Time: 6213-0865 PT Time Calculation (min): 19 min  PT Assessment / Plan / Recommendation Comments on Treatment Session  Pt continue to show marked improvement with mobility and ambulation distance, however still with +2 assist.     Follow Up Recommendations  Skilled nursing facility    Barriers to Discharge        Equipment Recommendations  Defer to next venue    Recommendations for Other Services    Frequency 7X/week   Plan Discharge plan remains appropriate    Precautions / Restrictions Precautions Precautions: Posterior Hip Precaution Comments: Continue to provide education Restrictions Weight Bearing Restrictions: Yes LLE Weight Bearing: Partial weight bearing LLE Partial Weight Bearing Percentage or Pounds: 50   Pertinent Vitals/Pain 2/10    Mobility  Bed Mobility Bed Mobility: Sit to Supine Sit to Supine: 1: +2 Total assist;HOB flat Sit to Supine: Patient Percentage: 20% Details for Bed Mobility Assistance: Assist for B LE into bed and for controlled descent of trunk.  cues for technique and safety.  Transfers Transfers: Sit to Stand;Stand to Sit Sit to Stand: 1: +2 Total assist;With upper extremity assist;From chair/3-in-1;With armrests Sit to Stand: Patient Percentage: 60% Stand to Sit: 1: +2 Total assist;With upper extremity assist;To bed;To elevated surface Stand to Sit: Patient Percentage: 60% Details for Transfer Assistance: Continues to show improved mobility with transfers, continue to provide cues for hand placement and LE management.  Ambulation/Gait Ambulation/Gait Assistance: 1: +2 Total assist Ambulation/Gait: Patient Percentage: 60% Ambulation Distance (Feet): 50 Feet Assistive device: Rolling walker Ambulation/Gait Assistance Details: Pt doing MUCH better with maintaining upright position when standing. Continues to require  max verbal and manual cuing for correct sequencing/technique with LE.  Some manual faciliation for LE advancement.  Gait Pattern: Step-to pattern;Antalgic;Trunk flexed;Narrow base of support;Scissoring;Decreased stride length    Exercises     PT Diagnosis:    PT Problem List:   PT Treatment Interventions:     PT Goals Acute Rehab PT Goals PT Goal Formulation: With family Time For Goal Achievement: 12/30/11 Potential to Achieve Goals: Fair Pt will go Sit to Supine/Side: with min assist PT Goal: Sit to Supine/Side - Progress: Progressing toward goal Pt will go Sit to Stand: with mod assist PT Goal: Sit to Stand - Progress: Progressing toward goal Pt will go Stand to Sit: with mod assist PT Goal: Stand to Sit - Progress: Progressing toward goal Pt will Transfer Bed to Chair/Chair to Bed: with mod assist PT Transfer Goal: Bed to Chair/Chair to Bed - Progress: Progressing toward goal Pt will Ambulate: with mod assist;with least restrictive assistive device;51 - 150 feet PT Goal: Ambulate - Progress: Updated due to goal met  Visit Information  Last PT Received On: 12/25/11 Assistance Needed: +2    Subjective Data  Subjective: I've put in my eight hours.  Patient Stated Goal: n/a   Cognition  Overall Cognitive Status: Appears within functional limits for tasks assessed/performed Arousal/Alertness: Awake/alert Orientation Level: Appears intact for tasks assessed Behavior During Session: [facility] for tasks performed Following Commands: Follows one step commands consistently    Balance     End of Session PT - End of Session Equipment Utilized During Treatment: Gait belt Activity Tolerance: Patient tolerated treatment well;Patient limited by fatigue Patient left: in bed;with call bell/phone within reach;with family/visitor present    Page, [name] 12/25/2011, 4:48 PM

## 2011-12-25 NOTE — Evaluation (Signed)
Occupational Therapy Evaluation Patient Details Name: Juan Diaz MRN: [id number] DOB: Mar 31, 1933 Today's Date: 12/25/2011 Time: 8413-2440 OT Time Calculation (min): 21 min  OT Assessment / Plan / Recommendation Clinical Impression  Pt is s/p Left hip hemiarthroplasty and displays decreased strength, increased pain, decreased functional mobility and will benefit from skilled OT services to improve safety and independence with self care for next venue of care. Pt somewhat anxious with movement. Wife present for session.    OT Assessment  Patient needs continued OT Services    Follow Up Recommendations  Skilled nursing facility    Barriers to Discharge      Equipment Recommendations  Defer to next venue    Recommendations for Other Services    Frequency  Min 1X/week    Precautions / Restrictions Precautions Precautions: Posterior Hip Precaution Booklet Issued: Yes (comment) Restrictions Weight Bearing Restrictions: Yes LLE Weight Bearing: Partial weight bearing LLE Partial Weight Bearing Percentage or Pounds: 50        ADL  Eating/Feeding: Simulated;Set up Where Assessed - Eating/Feeding: Bed level Grooming: Simulated;Wash/dry face;Set up;Supervision/safety Where Assessed - Grooming: Supine, head of bed up Upper Body Bathing: Simulated;Chest;Right arm;Left arm;Abdomen;Minimal assistance Where Assessed - Upper Body Bathing: Unsupported sitting Lower Body Bathing: +2 Total assistance Lower Body Bathing: Patient Percentage: 30% Where Assessed - Lower Body Bathing: Supported sit to stand Upper Body Dressing: Simulated;Moderate assistance Where Assessed - Upper Body Dressing: Unsupported sitting Lower Body Dressing: Simulated;+2 Total assistance Lower Body Dressing: Patient Percentage: 10% Where Assessed - Lower Body Dressing: Sopported sit to stand Toilet Transfer: Simulated;+2 Total assistance Toilet Transfer: Patient Percentage: 60% Toilet Transfer Method: Other  (comment) (ambulating with PT in hallway and chair pulled up.) Toileting - Clothing Manipulation and Hygiene: Simulated;+2 Total assistance Toileting - Clothing Manipulation and Hygiene: Patient Percentage: 0% Where Assessed - Glass blower/designer Manipulation and Hygiene: Standing Tub/Shower Transfer Method: Not assessed Equipment Used: Rolling walker ADL Comments: Pt needing some encouragement to continue with activity as he took just a few steps and wanted the chair pulled up. With encouragement, pt was able to mobilize with RW to the doorway of his room and then chair was pulled up. Began education on AE for LB ADL and pt/wife verbalized understanding. Will benefit from practice. Pt currently not able to let go of RW to simulate clothing management/hygiene.    OT Diagnosis: Generalized weakness  OT Problem List: Decreased strength;Decreased knowledge of use of DME or AE;Pain;Decreased knowledge of precautions OT Treatment Interventions: Self-care/ADL training;Therapeutic activities;DME and/or AE instruction;Patient/family education   OT Goals Acute Rehab OT Goals OT Goal Formulation: With patient/family Time For Goal Achievement: 01/01/12 Potential to Achieve Goals: Good ADL Goals Pt Will Perform Lower Body Bathing: with max assist;Sit to stand from chair;Sit to stand from bed;with adaptive equipment ADL Goal: Lower Body Bathing - Progress: Goal set today Pt Will Perform Lower Body Dressing: with max assist;Sit to stand from bed;Sit to stand from chair;with adaptive equipment ADL Goal: Lower Body Dressing - Progress: Goal set today Pt Will Transfer to Toilet: with mod assist;Stand pivot transfer;with DME;3-in-1 ADL Goal: Toilet Transfer - Progress: Goal set today Pt Will Perform Toileting - Clothing Manipulation: with mod assist;Standing ADL Goal: Toileting - Clothing Manipulation - Progress: Goal set today  Visit Information  Last OT Received On: 12/25/11 Assistance Needed: +2      Subjective Data  Subjective: better than I have been Patient Stated Goal: agreeable to get up with PT/OT   Prior Functioning  Home Living Lives  With: Spouse Available Help at Discharge: Skilled Nursing Facility Type of Home: House Home Access: Stairs to enter [employer] of Steps: 3-4 Home Layout: One level Bathroom Shower/Tub: Engineer, manufacturing systems: Standard Home Adaptive Equipment: Straight cane;Walker - rolling Prior Function Level of Independence: Independent;Needs assistance Needs Assistance: Dressing;Bathing Bath: Minimal Dressing: Minimal Able to Take Stairs?: Yes Driving: No Vocation: Retired Musician: HOH;No difficulties Dominant Hand: Right    Cognition  Overall Cognitive Status: Appears within functional limits for tasks assessed/performed Arousal/Alertness: Awake/alert Orientation Level: Appears intact for tasks assessed Behavior During Session: Other (comment) (alittle anxious)    Extremity/Trunk Assessment Right Upper Extremity Assessment RUE ROM/Strength/Tone: Deficits RUE ROM/Strength/Tone Deficits: note R UE shaky with movement. Only about 120 degrees shoulder flexion and pt resists with increased PROM/AAROM to shoulder. Pt states it is sore. Left Upper Extremity Assessment LUE ROM/Strength/Tone: Deficits LUE ROM/Strength/Tone Deficits: approximately 120 degrees AROM and pt resists increased PROM/AAROM to shoulder   Mobility Bed Mobility Bed Mobility: Supine to Sit;Sitting - Scoot to Edge of Bed Supine to Sit: 1: +2 Total assist;HOB elevated Supine to Sit: Patient Percentage: 30% Sitting - Scoot to Edge of Bed: 1: +2 Total assist Sitting - Scoot to Edge of Bed: Patient Percentage: 30% Details for Bed Mobility Assistance: Continues to require assist for B LE off of bed and for trunk to sit on EOB.  Pt did a little more to initiate UE placement, however still requires cues to assist.  Transfers Sit to Stand: 1: +2  Total assist;With upper extremity assist;From bed;From elevated surface Sit to Stand: Patient Percentage: 60% Stand to Sit: 1: +2 Total assist;With upper extremity assist;With armrests;To chair/3-in-1 Stand to Sit: Patient Percentage: 60% Details for Transfer Assistance: Pt MUCH improved this am compared to yesterday pm session.  More alert and talkative and states he is ready to get up.  Cues for proper hand placement and to maintain hip precautions as standing.     Exercise    Balance    End of Session OT - End of Session Equipment Utilized During Treatment: Gait belt Activity Tolerance: Patient tolerated treatment well Patient left: with call bell/phone within reach;in chair;with family/visitor present   Lennox Laity [phone] 12/25/2011, 10:16 AM

## 2011-12-25 NOTE — Progress Notes (Signed)
Subjective: Patient seen and examined this morning. Appears more awake and  oriented . Again had a temp of 100.7 overnight.  Objective:  Vital signs in last 24 hours:  Filed Vitals:   12/24/11 2140 12/25/11 0500 12/25/11 0800 12/25/11 1200  BP: 134/76 146/76    Pulse: 98 104    Temp: 100.7 F (38.2 C) 98.8 F (37.1 C)    TempSrc: Axillary Axillary    Resp: 17 16 16 14   Height:      Weight:      SpO2: 94% 93% 93% 93%    Intake/Output from previous day:   Intake/Output Summary (Last 24 hours) at 12/25/11 1333 Last data filed at 12/25/11 1000  Gross per 24 hour  Intake    240 ml  Output    100 ml  Net    140 ml    Physical Exam:  General: elderly male in no acute distress. HEENT: no pallor, no icterus, moist oral mucosa, no JVD, no lymphadenopathy  Heart: Normal s1 &s2 Regular rate and rhythm, without murmurs, rubs, gallops.  Lungs: Clear to auscultation bilaterally.  Abdomen: Soft, nontender, nondistended, positive bowel sounds.  Extremities: No clubbing cyanosis or edema with positive pedal pulses. Dressing over left hip intact  Neuro: sleepy but arousable. AAOX3 nonfocal.    Lab Results:  Basic Metabolic Panel:    Component Value Date/Time   NA 136 12/24/2011 0448   K 3.5 12/24/2011 0448   CL 101 12/24/2011 0448   CO2 26 12/24/2011 0448   BUN 11 12/24/2011 0448   CREATININE 0.74 12/24/2011 0448   GLUCOSE 106* 12/24/2011 0448   CALCIUM 8.7 12/24/2011 0448   CBC:    Component Value Date/Time   WBC 8.9 12/25/2011 0419   HGB 11.9* 12/25/2011 0419   HCT 35.3* 12/25/2011 0419   PLT 160 12/25/2011 0419   MCV 94.1 12/25/2011 0419   NEUTROABS 10.9* 12/22/2011 0410   LYMPHSABS 0.3* 12/22/2011 0410   MONOABS 0.8 12/22/2011 0410   EOSABS 0.0 12/22/2011 0410   BASOSABS 0.0 12/22/2011 0410    Recent Results (from the past 240 hour(s))  MRSA PCR SCREENING     Status: Normal   Collection Time   12/22/11 10:49 AM      Component Value Range Status Comment   MRSA by PCR  NEGATIVE  NEGATIVE Final   CULTURE, BLOOD (ROUTINE X 2)     Status: Normal (Preliminary result)   Collection Time   12/24/11  9:10 AM      Component Value Range Status Comment   Specimen Description BLOOD LEFT ARM   Final    Special Requests     Final    Value: BOTTLES DRAWN AEROBIC AND ANAEROBIC 7CC BOTH BOTTLES   Culture  Setup Time [id number]   Final    Culture     Final    Value:        BLOOD CULTURE RECEIVED NO GROWTH TO DATE CULTURE WILL BE HELD FOR 5 DAYS BEFORE ISSUING A FINAL NEGATIVE REPORT   Report Status PENDING   Incomplete   CULTURE, BLOOD (ROUTINE X 2)     Status: Normal (Preliminary result)   Collection Time   12/24/11  9:21 AM      Component Value Range Status Comment   Specimen Description BLOOD LEFT ARM   Final    Special Requests     Final    Value: BOTTLES DRAWN AEROBIC AND ANAEROBIC 5CC BOTH BOTTLES   Culture  Setup Time  [id number]   Final    Culture     Final    Value:        BLOOD CULTURE RECEIVED NO GROWTH TO DATE CULTURE WILL BE HELD FOR 5 DAYS BEFORE ISSUING A FINAL NEGATIVE REPORT   Report Status PENDING   Incomplete     Studies/Results: Dg Chest Port 1 View  12/24/2011  *RADIOLOGY REPORT*  Clinical Data: Fever.  Chills.  PORTABLE CHEST - 1 VIEW 12/24/2011 0931 hours:  Comparison: Portable chest x-ray 12/22/2011, 07/27/2010.  Two-view chest x-ray 09/11/2011, 06/24/2009.  Findings: Suboptimal inspiration which accounts for atelectasis at the bases and accentuates the cardiac silhouette.  Aeration has improved since examination 2 days ago.  No new pulmonary parenchymal abnormalities.  Vague nodular opacity at the left lung base is related to a healing left posterior 9th rib fracture. Cardiac silhouette normal in size for the AP portable technique, unchanged.  Pulmonary vascularity normal.  IMPRESSION: Suboptimal inspiration accounts for mild bibasilar atelectasis, though aeration has improved since examination 2 days ago.  No acute cardiopulmonary disease  otherwise.  Original Report Authenticated By: Arnell Sieving, M.D.    Medications: Scheduled Meds:   . ARIPiprazole  2 mg Oral BID  . bacitracin  1 application Topical BID  .  ceFAZolin (ANCEF) IV  2 g Intravenous Once  . docusate sodium  100 mg Oral BID  . enoxaparin  30 mg Subcutaneous Q12H  . ferrous sulfate  325 mg Oral TID PC  . finasteride  5 mg Oral Daily  . levofloxacin (LEVAQUIN) IV  500 mg Intravenous Q24H  . metoprolol  2.5 mg Intravenous Q12H  . polyethylene glycol  17 g Oral Daily  . venlafaxine XR  225 mg Oral QPC supper   Continuous Infusions:   . DISCONTD: sodium chloride 50 mL/hr at 12/23/11 2205   PRN Meds:.acetaminophen, acetaminophen, HYDROcodone-acetaminophen, menthol-cetylpyridinium, methocarbamol (ROBAXIN) IV, methocarbamol, metoCLOPramide (REGLAN) injection, metoCLOPramide, morphine injection, ondansetron (ZOFRAN) IV, ondansetron, phenol, promethazine  Assessment/Plan:  *left Hip fracture  S/p arthroplasty by ortho on 6/21  Stable post op  bowel regimen ,  DVT prophylaxis with sq lovenox bid  Pain control with robaxin and prn low dose morphine only if absolutely necessary to avoid sedation  Fever with AMS  Possibly related to UTI  cx sent  started on IV levaquin 500 mg daily from 6/23  blood cx and urine cx sent and will follow  CXR wnl  HTN (hypertension)  Continue home meds   Depression  Resume home meds   Diet: low sodium   DNR   Dispo:SNF if remains afebrile in am        LOS: 3 days   Gurbani Figge 12/25/2011, 1:33 PM

## 2011-12-26 ENCOUNTER — Encounter (HOSPITAL_COMMUNITY): Payer: Self-pay | Admitting: Orthopedic Surgery

## 2011-12-26 DIAGNOSIS — E782 Mixed hyperlipidemia: Secondary | ICD-10-CM

## 2011-12-26 DIAGNOSIS — R4182 Altered mental status, unspecified: Secondary | ICD-10-CM

## 2011-12-26 DIAGNOSIS — N390 Urinary tract infection, site not specified: Secondary | ICD-10-CM | POA: Clinically undetermined

## 2011-12-26 DIAGNOSIS — F329 Major depressive disorder, single episode, unspecified: Secondary | ICD-10-CM

## 2011-12-26 DIAGNOSIS — S72009A Fracture of unspecified part of neck of unspecified femur, initial encounter for closed fracture: Secondary | ICD-10-CM

## 2011-12-26 LAB — URINE CULTURE
Colony Count: NO GROWTH
Culture  Setup Time: 201306241318

## 2011-12-26 MED ORDER — HYDROCODONE-ACETAMINOPHEN 5-325 MG PO TABS: 1.0000 | ORAL_TABLET | Freq: Four times a day (QID) | ORAL | Status: AC | PRN

## 2011-12-26 MED ORDER — ENOXAPARIN SODIUM 30 MG/0.3ML ~~LOC~~ SOLN: 30.0000 mg | Freq: Two times a day (BID) | SUBCUTANEOUS | Status: AC

## 2011-12-26 MED ORDER — LEVOFLOXACIN 500 MG PO TABS: 500.0000 mg | ORAL_TABLET | Freq: Every day | ORAL | Status: AC

## 2011-12-26 MED ORDER — METHOCARBAMOL 500 MG PO TABS: 500.0000 mg | ORAL_TABLET | Freq: Four times a day (QID) | ORAL | Status: AC | PRN

## 2011-12-26 MED ORDER — DSS 100 MG PO CAPS: 100.0000 mg | ORAL_CAPSULE | Freq: Two times a day (BID) | ORAL | Status: AC | PRN

## 2011-12-26 NOTE — Clinical Documentation Improvement (Signed)
CHANGE MENTAL STATUS DOCUMENTATION CLARIFICATION   THIS DOCUMENT IS NOT A PERMANENT PART OF THE MEDICAL RECORD  TO RESPOND TO THE THIS QUERY, FOLLOW THE INSTRUCTIONS BELOW:  1. If needed, update documentation for the patient's encounter via the notes activity.  2. Access this query again and click edit on the In [employer].  3. After updating, or not, click F2 to complete all highlighted (required) fields concerning your review. Select "additional documentation in the medical record" OR "no additional documentation provided".  4. Click Sign note button.  5. The deficiency will fall out of your In Basket *Please let us know if you are not able to complete this workflow by phone or e-mail (listed below).         12/26/11  Dear Dr. Eddie North Marton Redwood  In an effort to better capture your patient's severity of illness, reflect appropriate length of stay and utilization of resources, a review of the patient medical record has revealed the following indicators.    Based on your clinical judgment, please clarify and document in a progress note and/or discharge summary the clinical condition associated with the following supporting information:  In responding to this query please exercise your independent judgment.  The fact that a query is asked, does not imply that any particular answer is desired or expected.  Pt admitted for  Left hip fx.  According to pn 12/24/11 pt  With fever and AMS in setting of UTI and s/p Left bipolar hip   Please clarify whether or not 'AMS' can be further specified as one of the diagnosis listed below and document in pn and d/c summary.   Possible Clinical Conditions?  _______Encephalopathy (describe type if known)                       Anoxic                       Septic                       Alcoholic                        Hepatic                       Hypertensive                       Metabolic                       _______  Toxic   ________Other Condition__________________ _______Cannot Clinically Determine   Supporting Information:  Risk Factors:  Signs & Symptoms: PN 12/24/11 Patient is more awake but very confused  And disoriented. Had a temp spike of 101.4 overnight. UA from admission suggests UTI. Hip pain controlled with current meds.    PN 12/24/11 Fever with AMS Possibly related to UTI  cx sent  started on IV levaquin 500 mg daily from today  blood cx sent and will follow  CXR ordered  Diagnostics: Lab: Component      Nitrite Leukocytes, UA  Latest Ref Rng      NEGATIVE NEGATIVE  12/22/2011     9:56 AM POSITIVE (A) SMALL (A)   Component      Neutrophils Relative NEUT#  Latest Ref Rng      43 - 77 % 1.7 -  7.7 K/uL  12/22/2011     4:10 AM 91 (H) 10.9 (H)   Component      WBC  Latest Ref Rng      4.0 - 10.5 K/uL  12/22/2011     4:10 AM 12.0 (H)   Component      WBC  Latest Ref Rng      4.0 - 10.5 K/uL  12/23/2011      12.3 (H)  12/24/2011     4:48 AM 10.7 (H)   Blood Culture pending   Treatment: Monitoring  Reviewed:  no additional documentation provided ljh  Thank You,  Enis Slipper  RN, BSN, CCDS Clinical Documentation Specialist [facility] Dept Pager: [phone] / E-mail: [email or web address] Information Management [place]

## 2011-12-26 NOTE — Progress Notes (Signed)
Patient OOB to reclining chair at this time. He will discharge to SNF [facility] today. Report called in to Nurse Billie. Wife at bedside awaiting transport to SNF.

## 2011-12-26 NOTE — Progress Notes (Signed)
Occupational Therapy Treatment Patient Details Name: Juan Diaz MRN: [id number] DOB: 28-Apr-1933 Today's Date: 12/26/2011 Time: 4098-1191 OT Time Calculation (min): 30 min  OT Assessment / Plan / Recommendation Comments on Treatment Session Attempted practice with AE but pt seemed to have difficulty not only from a weakness standpoint in UEs in utilize AE but also with following commands to use AE properly despite max cueing and repeating/demonstrating.    Follow Up Recommendations  Skilled nursing facility    Barriers to Discharge       Equipment Recommendations  Defer to next venue    Recommendations for Other Services    Frequency Min 1X/week   Plan Discharge plan remains appropriate    Precautions / Restrictions Precautions Precautions: Posterior Hip Restrictions Weight Bearing Restrictions: Yes LLE Weight Bearing: Partial weight bearing LLE Partial Weight Bearing Percentage or Pounds: 50        ADL  Lower Body Dressing: Simulated;+1 Total assistance;Other (comment) (with AE. see notes below) Where Assessed - Lower Body Dressing: Unsupported sitting ADL Comments: Educated on AE further. Wife present. Pt with much difficulty grasping concept of how to use each AE piece even after multiple explanations and demonstrations. Grasp is weak to pull on handles of sock aid and also note UEs are shaky. When asked if his UEs are more shaky than normal, pt states "they arent shaky, it  (sock aid) just keeps slipping from my hand." Pt able to practice picking up object from floor with reacher with mod assist and max cuing. But required total assist pt 10% with trying to use sock aid, reacher to doff sock, and sponge to simulate bathing LB. ??cognition and how much he understands of commands..     OT Diagnosis:    OT Problem List:   OT Treatment Interventions:     OT Goals ADL Goals ADL Goal: Lower Body Dressing - Progress: Not progressing  Visit Information  Last OT Received  On: 12/26/11 Assistance Needed: +2    Subjective Data  Subjective: we can try (when asked about practicing AE) Patient Stated Goal: none stated. agreeable to work with OT   Prior Functioning       Cognition  Overall Cognitive Status: Impaired Area of Impairment: Problem solving Arousal/Alertness: Awake/alert Behavior During Session: [facility] for tasks performed    Mobility     Exercises    Balance    End of Session OT - End of Session Activity Tolerance: Patient tolerated treatment well;Other (comment) (although requiring max cues to try to follow commands) Patient left: in chair;with call bell/phone within reach;with family/visitor present   Lennox Laity [phone] 12/26/2011, 11:18 AM

## 2011-12-26 NOTE — Discharge Summary (Signed)
Patient ID: Ebin Palazzi MRN: [id number] DOB/AGE: May 10, 1933 78 y.o.  Admit date: 12/22/2011 Discharge date: 12/26/2011  Primary Care Physician: RANDALL HARRIS with [facility] Discharge Diagnoses:     Principal Problem:  *left Hip fracture  Active Problems:  Urinary tract infection  HTN (hypertension)  Depression   Medication List  As of 12/26/2011 11:44 AM   STOP taking these medications                  TAKE these medications         ALPRAZolam 0.25 MG tablet   Commonly known as: XANAX   Take 0.125 mg by mouth daily as needed. For anxiety      ARIPiprazole 5 MG tablet   Commonly known as: ABILIFY   Take 2.5 mg by mouth 2 (two) times daily.      ARIPiprazole 2 MG tablet   Commonly known as: ABILIFY   Take 1 tablet (2 mg total) by mouth 2 (two) times daily.      aspirin 81 MG chewable tablet   Chew 81 mg by mouth daily.      buPROPion 300 MG 24 hr tablet   Commonly known as: WELLBUTRIN XL   Take 300 mg by mouth daily.      DSS 100 MG Caps   Take 100 mg by mouth 2 (two) times daily as needed for constipation.      enoxaparin 30 MG/0.3ML injection   Commonly known as: LOVENOX   Inject 0.3 mLs (30 mg total) into the skin every 12 (twelve) hours.      feeding supplement Liqd   Take 237 mLs by mouth at bedtime.      finasteride 5 MG tablet   Commonly known as: PROSCAR   Take 5 mg by mouth daily.      HYDROcodone-acetaminophen 5-325 MG per tablet   Commonly known as: NORCO   Take 1 tablet by mouth every 6 (six) hours as needed.      levofloxacin 500 MG tablet   Commonly known as: LEVAQUIN   Take 1 tablet (500 mg total) by mouth daily.      losartan 100 MG tablet   Commonly known as: COZAAR   Take 100 mg by mouth daily.      methocarbamol 500 MG tablet   Commonly known as: ROBAXIN   Take 1 tablet (500 mg total) by mouth every 6 (six) hours as needed (pain).      multivitamin with minerals Tabs   Take 1 tablet by mouth daily.     polyethylene glycol packet   Commonly known as: MIRALAX / GLYCOLAX   Take 17 g by mouth daily.      venlafaxine XR 75 MG 24 hr capsule   Commonly known as: EFFEXOR-XR   Take 225 mg by mouth daily.            Disposition and Follow-up: SNF with orthopedics follow up in 2 weeks  Consults: Viviann Spare Norrris ( orthopedics)  Significant Diagnostic Studies:  Dg Chest 1 View  12/22/2011  *RADIOLOGY REPORT*  Clinical Data: Left hip fracture; preoperative chest radiograph.  CHEST - 1 VIEW  Comparison: Chest radiograph performed 09/11/2011  Findings: There is persistent mild elevation of the left hemidiaphragm.  Mild bibasilar opacities likely reflect atelectasis.  Mild vascular congestion is seen, without significant pulmonary edema.  No pleural effusion or pneumothorax is seen.  The cardiomediastinal silhouette is normal in size.  Calcification is noted within the aortic arch.  No acute osseous abnormalities are identified.  IMPRESSION: Mild bibasilar opacities likely reflect atelectasis; mild vascular congestion, without significant pulmonary edema.  Original Report Authenticated By: Tonia Ghent, M.D.   Dg Elbow Complete Left  12/22/2011  *RADIOLOGY REPORT*  Clinical Data: Status post fall; laceration to the olecranon region.  LEFT ELBOW - COMPLETE 3+ VIEW  Comparison: None.  Findings: There is no evidence of fracture or dislocation.  The visualized joint spaces are preserved.  No significant joint effusion is identified.  The known soft tissue laceration is difficult to fully characterize on radiograph.  IMPRESSION: No evidence of fracture or dislocation.  Original Report Authenticated By: Tonia Ghent, M.D.   Dg Hip Complete Left  12/22/2011  *RADIOLOGY REPORT*  Clinical Data: Status post fall; left hip pain.  LEFT HIP - COMPLETE 2+ VIEW  Comparison: None.  Findings: There is a comminuted subcapital fracture of the left femoral neck, with mild displacement of fragments.  The left femoral head  remains seated at the acetabulum.  No additional fractures are seen.  Mild superior joint space narrowing is noted at the right hip; the right hip is otherwise grossly unremarkable in appearance.  The sacroiliac joints are unremarkable in appearance.  The visualized bowel gas pattern is grossly unremarkable in appearance.  IMPRESSION: Comminuted subcapital fracture of the left femoral neck, with mild displacement of fragments.  Original Report Authenticated By: Tonia Ghent, M.D.   Dg Pelvis Portable  12/22/2011  *RADIOLOGY REPORT*  Clinical Data: History of left hip arthroplasty.  Postoperative status.  PORTABLE PELVIS  Comparison: 09/12/2011.  Findings: Left hip arthroplasty has been performed.  In the AP projection there is no evidence of dislocation.  There is expected relationship between the acetabular and femoral components.  The acetabular component appears seated in the acetabulum on the left. No disruption of hardware is seen.  There is osteopenic appearance of the bones.  There is hip degenerative spurring on the right. There is degenerative spondylosis.  IMPRESSION: The patient is postoperative.  Left hip arthroplasty has been performed.  No dislocation is seen.  There is expected relationship of hardware components.  Original Report Authenticated By: Crawford Givens, M.D.    Brief H and P: For complete details please refer to admission H and P, but in brief Theus Espin is a 78 y.o. male has a past medical history of Hypertension ,depression and Urinary retention. Presented with  Tripped on the chair and fell around 9 pm. Denies any chest pain or SOB. He goes out and works out at [employer] on daily bases.     Physical Exam on Discharge:  Filed Vitals:   12/26/11 0000 12/26/11 0400 12/26/11 0425 12/26/11 0757  BP:   132/71   Pulse:   97   Temp:   97.9 F (36.6 C)   TempSrc:   Oral   Resp: 16 16 20 20   Height:      Weight:      SpO2:   95% 94%     Intake/Output Summary (Last 24  hours) at 12/26/11 1144 Last data filed at 12/26/11 0905  Gross per 24 hour  Intake    440 ml  Output    275 ml  Net    165 ml    General: elderly male in no acute distress.  HEENT: no pallor, no icterus, moist oral mucosa, no JVD, no lymphadenopathy  Heart: Normal s1 &s2 Regular rate and rhythm, without murmurs, rubs, gallops.  Lungs: Clear to auscultation bilaterally.  Abdomen: Soft, nontender, nondistended, positive bowel sounds.  Extremities: No clubbing cyanosis or edema with positive pedal pulses. Dressing over left hip intact  Neuro: . AAOX2  nonfocal.   CBC:    Component Value Date/Time   WBC 8.9 12/25/2011 0419   HGB 11.9* 12/25/2011 0419   HCT 35.3* 12/25/2011 0419   PLT 160 12/25/2011 0419   MCV 94.1 12/25/2011 0419   NEUTROABS 10.9* 12/22/2011 0410   LYMPHSABS 0.3* 12/22/2011 0410   MONOABS 0.8 12/22/2011 0410   EOSABS 0.0 12/22/2011 0410   BASOSABS 0.0 12/22/2011 0410    Basic Metabolic Panel:    Component Value Date/Time   NA 136 12/24/2011 0448   K 3.5 12/24/2011 0448   CL 101 12/24/2011 0448   CO2 26 12/24/2011 0448   BUN 11 12/24/2011 0448   CREATININE 0.74 12/24/2011 0448   GLUCOSE 106* 12/24/2011 0448   CALCIUM 8.7 12/24/2011 0448    Hospital Course:   *left Hip fracture  S/p arthroplasty by ortho on 6/21  Stable post op  DVT prophylaxis with sq lovenox bid  Pain control with robaxin and prn low dose morphine only if absolutely necessary to avoid sedation , continue bowel care PT recommends SNF  Fever with AMS  Possibly related to UTI  cx negative started on IV levaquin 500 mg daily from 6/23 and will treat for 7 day course blood cx negative CXR wnl  Now afebrile  And patient more oriented . He is however confused than normal. He was admitted 3 months back for similar confusion thought to be de to UTI. TSH and RPR done at that time was normal.  Would recommend outpatient dementia w/up if not improved following resolution of  acute issues  HTN  (hypertension)  Continue home meds   Depression  Resume home meds   Patient to be discharged to SNF with outpt follow up with PCP and orthopedics  Time spent on Discharge: 45 minutes  Signed: Eddie North 12/26/2011, 11:44 AM

## 2011-12-26 NOTE — Progress Notes (Signed)
Discharge summary sent to payer through [facility]  

## 2011-12-26 NOTE — Progress Notes (Signed)
Orthopedics Progress Note  Subjective: Pt sitting comfortably in no acute distress awaiting transportation to snf   Objective:  Filed Vitals:   12/26/11 1200  BP:   Pulse:   Temp:   Resp: 18    General: Awake and alert  Musculoskeletal: left hip incision healing well, nv intact distally Neurovascularly intact  Lab Results  Component Value Date   WBC 8.9 12/25/2011   HGB 11.9* 12/25/2011   HCT 35.3* 12/25/2011   MCV 94.1 12/25/2011   PLT 160 12/25/2011       Component Value Date/Time   NA 136 12/24/2011 0448   K 3.5 12/24/2011 0448   CL 101 12/24/2011 0448   CO2 26 12/24/2011 0448   GLUCOSE 106* 12/24/2011 0448   BUN 11 12/24/2011 0448   CREATININE 0.74 12/24/2011 0448   CALCIUM 8.7 12/24/2011 0448   GFRNONAA 87* 12/24/2011 0448   GFRAA >90 12/24/2011 0448    Lab Results  Component Value Date   INR 1.07 12/22/2011   INR 1.04 03/20/2011    Assessment/Plan: POD #4 s/p Procedure(s):left  ARTHROPLASTY BIPOLAR HIP  D/c to snf F/u in 2 weeks Weight bearing as tolerated  Almedia Balls. Ranell Patrick, MD 12/26/2011 1:45 PM

## 2011-12-26 NOTE — Progress Notes (Signed)
Physical Therapy Treatment Patient Details Name: Worth Kober MRN: [id number] DOB: [birth year] Today's Date: 12/26/2011 Time: 8416-6063 PT Time Calculation (min): 24 min  PT Assessment / Plan / Recommendation Comments on Treatment Session  Pt requiring increased assist for transfers and ambulation during am session vs yesterdays session.  MAX cuing for maintaining hip precautions.     Follow Up Recommendations  Skilled nursing facility    Barriers to Discharge        Equipment Recommendations  Defer to next venue    Recommendations for Other Services    Frequency 7X/week   Plan Discharge plan remains appropriate    Precautions / Restrictions Precautions Precautions: Posterior Hip Precaution Booklet Issued: Yes (comment) Precaution Comments: Continue to provide education Restrictions Weight Bearing Restrictions: Yes LLE Weight Bearing: Partial weight bearing LLE Partial Weight Bearing Percentage or Pounds: 50   Pertinent Vitals/Pain 3/10    Mobility  Bed Mobility Bed Mobility: Supine to Sit;Sitting - Scoot to Edge of Bed Supine to Sit: 1: +2 Total assist;HOB elevated Supine to Sit: Patient Percentage: 30% Sitting - Scoot to Edge of Bed: 1: +2 Total assist Sitting - Scoot to Edge of Bed: Patient Percentage: 30% Details for Bed Mobility Assistance: Provided assist for B LE off of bed and for trunk.  Pt showing improved mobiltiy getting LEs to EOB.   Transfers Transfers: Sit to Stand;Stand to Sit Sit to Stand: 1: +2 Total assist;From elevated surface;With upper extremity assist;From bed Sit to Stand: Patient Percentage: 50% Stand to Sit: 1: +2 Total assist;With upper extremity assist;To chair/3-in-1;With armrests Stand to Sit: Patient Percentage: 30% Details for Transfer Assistance: Noted some decrease in pts ability to assist with transfers during am sesion.  Provided somewhat increased assist for forward/upward weight shift and steadying with continued cues for hand  placement and glute activation for upright stance.  Ambulation/Gait Ambulation/Gait Assistance: 1: +2 Total assist Ambulation/Gait: Patient Percentage: 50% Ambulation Distance (Feet): 40 Feet Assistive device: Rolling walker Ambulation/Gait Assistance Details: Noted ambulation not as well as yesterday.  Pt required increased assist to maintain upright position and MAX verbal and manual cuing for LE advancement and to maintain foot in straight position due to pt tendency to IR foot and step sideways.   Gait Pattern: Step-to pattern;Antalgic;Trunk flexed;Narrow base of support;Scissoring;Decreased stride length Stairs: No Wheelchair Mobility Wheelchair Mobility: No    Exercises     PT Diagnosis:    PT Problem List:   PT Treatment Interventions:     PT Goals Acute Rehab PT Goals PT Goal Formulation: With family Time For Goal Achievement: 12/30/11 Potential to Achieve Goals: Fair Pt will go Supine/Side to Sit: with min assist PT Goal: Supine/Side to Sit - Progress: Progressing toward goal Pt will go Sit to Stand: with mod assist PT Goal: Sit to Stand - Progress: Progressing toward goal Pt will go Stand to Sit: with mod assist PT Goal: Stand to Sit - Progress: Progressing toward goal Pt will Transfer Bed to Chair/Chair to Bed: with mod assist PT Transfer Goal: Bed to Chair/Chair to Bed - Progress: Progressing toward goal Pt will Ambulate: with mod assist;with least restrictive assistive device;51 - 150 feet PT Goal: Ambulate - Progress: Progressing toward goal  Visit Information  Last PT Received On: 12/26/11 Assistance Needed: +2    Subjective Data  Subjective: Something is wrong with that walker.  Patient Stated Goal: n/a   Cognition  Overall Cognitive Status: Impaired Area of Impairment: Following commands Arousal/Alertness: Awake/alert Orientation Level: Appears intact for tasks  assessed Behavior During Session: [facility] for tasks performed Following Commands: Follows one  step commands inconsistently    Balance     End of Session PT - End of Session Equipment Utilized During Treatment: Gait belt Activity Tolerance: Patient limited by fatigue Patient left: in chair;with call bell/phone within reach;with family/visitor present Nurse Communication: Mobility status    Page, [name] 12/26/2011, 2:11 PM

## 2011-12-26 NOTE — Progress Notes (Signed)
Physical Therapy Treatment Patient Details Name: Juan Diaz MRN: [id number] DOB: 07-09-1932 Today's Date: 12/26/2011 Time: 3244-0102 PT Time Calculation (min): 22 min  PT Assessment / Plan / Recommendation Comments on Treatment Session  Pt doing better in pm session with ambulation.  Pt to D/C today to SNF>     Follow Up Recommendations  Skilled nursing facility    Barriers to Discharge        Equipment Recommendations  Defer to next venue    Recommendations for Other Services    Frequency 7X/week   Plan Discharge plan remains appropriate    Precautions / Restrictions Precautions Precautions: Posterior Hip Precaution Booklet Issued: Yes (comment) Precaution Comments: Continue to provide education Restrictions Weight Bearing Restrictions: Yes LLE Weight Bearing: Partial weight bearing LLE Partial Weight Bearing Percentage or Pounds: 50   Pertinent Vitals/Pain 3/10    Mobility  Bed Mobility Bed Mobility: Not assessed Supine to Sit: 1: +2 Total assist;HOB elevated Supine to Sit: Patient Percentage: 30% Sitting - Scoot to Edge of Bed: 1: +2 Total assist Sitting - Scoot to Edge of Bed: Patient Percentage: 30% Details for Bed Mobility Assistance: Provided assist for B LE off of bed and for trunk.  Pt showing improved mobiltiy getting LEs to EOB.   Transfers Transfers: Sit to Stand;Stand to Sit Sit to Stand: 1: +2 Total assist;With upper extremity assist;From chair/3-in-1;With armrests Sit to Stand: Patient Percentage: 60% Stand to Sit: 1: +2 Total assist;With upper extremity assist;To chair/3-in-1;With armrests Stand to Sit: Patient Percentage: 60% Details for Transfer Assistance: Improved mobility in pm session with continued cues for UE placement and LE management when sitting/standing.  Ambulation/Gait Ambulation/Gait Assistance: 1: +2 Total assist Ambulation/Gait: Patient Percentage: 60% Ambulation Distance (Feet): 60 Feet Assistive device: Rolling  walker Ambulation/Gait Assistance Details: Doing better with ambulation in pm vs am.  Continue to provide max cuing for sequencing/technique and some manual assist for maintaining foot in straight positioning, esp with turns.  Gait Pattern: Step-to pattern;Antalgic;Trunk flexed;Narrow base of support;Scissoring;Decreased stride length Stairs: No Wheelchair Mobility Wheelchair Mobility: No    Exercises     PT Diagnosis:    PT Problem List:   PT Treatment Interventions:     PT Goals Acute Rehab PT Goals PT Goal Formulation: With family Time For Goal Achievement: 12/30/11 Potential to Achieve Goals: Fair Pt will go Supine/Side to Sit: with min assist PT Goal: Supine/Side to Sit - Progress: Progressing toward goal Pt will go Sit to Stand: with mod assist PT Goal: Sit to Stand - Progress: Progressing toward goal Pt will go Stand to Sit: with mod assist PT Goal: Stand to Sit - Progress: Progressing toward goal Pt will Transfer Bed to Chair/Chair to Bed: with mod assist PT Transfer Goal: Bed to Chair/Chair to Bed - Progress: Progressing toward goal Pt will Ambulate: with mod assist;with least restrictive assistive device;51 - 150 feet PT Goal: Ambulate - Progress: Progressing toward goal  Visit Information  Last PT Received On: 12/26/11 Assistance Needed: +2    Subjective Data  Subjective: What are they having to eat over there tonight Patient Stated Goal: n/a   Cognition  Overall Cognitive Status: Appears within functional limits for tasks assessed/performed Area of Impairment: Following commands Arousal/Alertness: Awake/alert Orientation Level: Appears intact for tasks assessed Behavior During Session: [facility] for tasks performed Following Commands: Follows one step commands inconsistently    Balance     End of Session PT - End of Session Equipment Utilized During Treatment: Gait belt Activity Tolerance: Patient  tolerated treatment well Patient left: in chair;with call  bell/phone within reach;with family/visitor present Nurse Communication: Mobility status    Page, [name] 12/26/2011, 3:57 PM

## 2011-12-27 NOTE — Progress Notes (Signed)
Clinical Social Work Department CLINICAL SOCIAL WORK PLACEMENT NOTE 12/27/2011  Patient:  Juan Diaz, Juan Diaz  Account Number:  [id number] Admit date:  12/22/2011  Clinical Social Worker:  Leron Croak, CLINICAL SOCIAL WORKER  Date/time:  12/24/2011 05:15 PM  Clinical Social Work is seeking post-discharge placement for this patient at the following level of care:   SKILLED NURSING   (*CSW will update this form in Epic as items are completed)   12/24/2011  Patient/family provided with [facility] Department of Clinical Social Work's list of facilities offering this level of care within the geographic area requested by the patient (or if unable, by the patient's family).  12/24/2011  Patient/family informed of their freedom to choose among providers that offer the needed level of care, that participate in Medicare, Medicaid or managed care program needed by the patient, have an available bed and are willing to accept the patient.  12/24/2011  Patient/family informed of [facility]' ownership interest in [facility], as well as of the fact that they are under no obligation to receive care at this facility.  PASARR submitted to EDS on  PASARR number received from EDS on 09/15/2011  FL2 transmitted to all facilities in geographic area requested by pt/family on  12/24/2011 FL2 transmitted to all facilities within larger geographic area on 12/24/2011  Patient informed that his/her managed care company has contracts with or will negotiate with  certain facilities, including the following:     Patient/family informed of bed offers received:  12/25/2011 Patient chooses bed at [facility] Physician recommends and patient chooses bed at    Patient to be transferred to [facility] on  12/26/2011 Patient to be transferred to facility by [facility]  The following physician request were entered in Epic:   Additional Comments:  Cori Razor LCSW [phone]

## 2011-12-30 LAB — CULTURE, BLOOD (ROUTINE X 2)
Culture: NO GROWTH
Culture: NO GROWTH

## 2012-01-29 ENCOUNTER — Other Ambulatory Visit (HOSPITAL_BASED_OUTPATIENT_CLINIC_OR_DEPARTMENT_OTHER): Payer: Self-pay | Admitting: Internal Medicine

## 2012-01-29 DIAGNOSIS — R519 Headache, unspecified: Secondary | ICD-10-CM

## 2012-01-30 ENCOUNTER — Ambulatory Visit (HOSPITAL_COMMUNITY)
Admission: RE | Admit: 2012-01-30 | Discharge: 2012-01-30 | Disposition: A | Discharge status: Home / Self Care | Payer: Medicare Other | Source: Ambulatory Visit | Attending: Internal Medicine | Admitting: Internal Medicine

## 2012-01-30 DIAGNOSIS — I6789 Other cerebrovascular disease: Secondary | ICD-10-CM | POA: Insufficient documentation

## 2012-01-30 DIAGNOSIS — G319 Degenerative disease of nervous system, unspecified: Secondary | ICD-10-CM | POA: Insufficient documentation

## 2015-04-26 ENCOUNTER — Emergency Department (HOSPITAL_COMMUNITY)
Admission: EM | Admit: 2015-04-26 | Discharge: 2015-05-04 | Disposition: E | Payer: Medicare Other | Attending: Emergency Medicine | Admitting: Emergency Medicine

## 2015-04-26 ENCOUNTER — Emergency Department (HOSPITAL_COMMUNITY): Payer: Medicare Other

## 2015-04-26 ENCOUNTER — Encounter (HOSPITAL_COMMUNITY): Payer: Self-pay | Admitting: Emergency Medicine

## 2015-04-26 DIAGNOSIS — R001 Bradycardia, unspecified: Secondary | ICD-10-CM | POA: Insufficient documentation

## 2015-04-26 DIAGNOSIS — Z79899 Other long term (current) drug therapy: Secondary | ICD-10-CM | POA: Insufficient documentation

## 2015-04-26 DIAGNOSIS — Z7901 Long term (current) use of anticoagulants: Secondary | ICD-10-CM | POA: Insufficient documentation

## 2015-04-26 DIAGNOSIS — Z87891 Personal history of nicotine dependence: Secondary | ICD-10-CM | POA: Insufficient documentation

## 2015-04-26 DIAGNOSIS — Z7982 Long term (current) use of aspirin: Secondary | ICD-10-CM | POA: Diagnosis not present

## 2015-04-26 DIAGNOSIS — I469 Cardiac arrest, cause unspecified: Secondary | ICD-10-CM | POA: Insufficient documentation

## 2015-04-26 DIAGNOSIS — I10 Essential (primary) hypertension: Secondary | ICD-10-CM | POA: Insufficient documentation

## 2015-04-26 DIAGNOSIS — I442 Atrioventricular block, complete: Secondary | ICD-10-CM | POA: Diagnosis not present

## 2015-04-26 LAB — CBG MONITORING, ED: Glucose-Capillary: 232 mg/dL — ABNORMAL HIGH (ref 65–99)

## 2015-04-26 LAB — CBC WITH DIFFERENTIAL/PLATELET
Basophils Absolute: 0 10*3/uL (ref 0.0–0.1)
Basophils Relative: 0 %
EOS ABS: 0 10*3/uL (ref 0.0–0.7)
EOS PCT: 0 %
HCT: 41.8 % (ref 39.0–52.0)
Hemoglobin: 13.6 g/dL (ref 13.0–17.0)
LYMPHS ABS: 2 10*3/uL (ref 0.7–4.0)
Lymphocytes Relative: 9 %
MCH: 32.4 pg (ref 26.0–34.0)
MCHC: 32.5 g/dL (ref 30.0–36.0)
MCV: 99.5 fL (ref 78.0–100.0)
MONOS PCT: 6 %
Monocytes Absolute: 1.2 10*3/uL — ABNORMAL HIGH (ref 0.1–1.0)
Neutro Abs: 17.9 10*3/uL — ABNORMAL HIGH (ref 1.7–7.7)
Neutrophils Relative %: 85 %
PLATELETS: 210 10*3/uL (ref 150–400)
RBC: 4.2 MIL/uL — ABNORMAL LOW (ref 4.22–5.81)
RDW: 12.8 % (ref 11.5–15.5)
WBC: 21.1 10*3/uL — ABNORMAL HIGH (ref 4.0–10.5)

## 2015-04-26 LAB — COMPREHENSIVE METABOLIC PANEL
ALK PHOS: 126 U/L (ref 38–126)
ALT: 422 U/L — ABNORMAL HIGH (ref 17–63)
ANION GAP: 17 — AB (ref 5–15)
AST: 404 U/L — ABNORMAL HIGH (ref 15–41)
Albumin: 2.9 g/dL — ABNORMAL LOW (ref 3.5–5.0)
BUN: 42 mg/dL — ABNORMAL HIGH (ref 6–20)
CALCIUM: 8.6 mg/dL — AB (ref 8.9–10.3)
CHLORIDE: 104 mmol/L (ref 101–111)
CO2: 17 mmol/L — AB (ref 22–32)
Creatinine, Ser: 2.14 mg/dL — ABNORMAL HIGH (ref 0.61–1.24)
GFR, EST AFRICAN AMERICAN: 32 mL/min — AB (ref >60)
GFR, EST NON AFRICAN AMERICAN: 27 mL/min — AB (ref >60)
Glucose, Bld: 267 mg/dL — ABNORMAL HIGH (ref 65–99)
Potassium: 5.2 mmol/L — ABNORMAL HIGH (ref 3.5–5.1)
SODIUM: 138 mmol/L (ref 135–145)
Total Bilirubin: 1.5 mg/dL — ABNORMAL HIGH (ref 0.3–1.2)
Total Protein: 5 g/dL — ABNORMAL LOW (ref 6.5–8.1)

## 2015-04-26 LAB — I-STAT CHEM 8, ED
BUN: 60 mg/dL — AB (ref 6–20)
CHLORIDE: 103 mmol/L (ref 101–111)
CREATININE: 1.8 mg/dL — AB (ref 0.61–1.24)
Calcium, Ion: 1.04 mmol/L — ABNORMAL LOW (ref 1.13–1.30)
GLUCOSE: 266 mg/dL — AB (ref 65–99)
HEMATOCRIT: 44 % (ref 39.0–52.0)
HEMOGLOBIN: 15 g/dL (ref 13.0–17.0)
POTASSIUM: 4.9 mmol/L (ref 3.5–5.1)
Sodium: 137 mmol/L (ref 135–145)
TCO2: 19 mmol/L (ref 0–100)

## 2015-04-26 LAB — PHOSPHORUS: PHOSPHORUS: 6.8 mg/dL — AB (ref 2.5–4.6)

## 2015-04-26 LAB — I-STAT TROPONIN, ED: TROPONIN I, POC: 0.04 ng/mL (ref 0.00–0.08)

## 2015-04-26 LAB — I-STAT CG4 LACTIC ACID, ED: Lactic Acid, Venous: 8.26 mmol/L (ref 0.5–2.0)

## 2015-04-26 LAB — MAGNESIUM: MAGNESIUM: 2.9 mg/dL — AB (ref 1.7–2.4)

## 2015-04-26 LAB — CK: Total CK: 72 U/L (ref 49–397)

## 2015-04-26 MED ORDER — DOPAMINE-DEXTROSE 3.2-5 MG/ML-% IV SOLN
0.0000 ug/kg/min | INTRAVENOUS | Status: DC
  Administered 2015-04-26: 5 ug/kg/min via INTRAVENOUS

## 2015-04-26 MED ORDER — ATROPINE SULFATE 1 MG/ML IJ SOLN
INTRAMUSCULAR | Status: DC | PRN
  Administered 2015-04-26: 1 mg via INTRAVENOUS

## 2015-04-26 MED ORDER — ROCURONIUM BROMIDE 50 MG/5ML IV SOLN
INTRAVENOUS | Status: DC | PRN
  Administered 2015-04-26: 50 mg via INTRAVENOUS

## 2015-04-26 MED ORDER — ETOMIDATE 2 MG/ML IV SOLN
INTRAVENOUS | Status: DC | PRN
  Administered 2015-04-26: 20 mg via INTRAVENOUS

## 2015-04-26 MED ORDER — DOPAMINE-DEXTROSE 3.2-5 MG/ML-% IV SOLN
INTRAVENOUS | Status: AC
  Filled 2015-04-26: qty 250

## 2015-04-27 MED FILL — Medication: Qty: 1 | Status: AC

## 2015-05-04 NOTE — ED Notes (Signed)
Wife wishes to withdraw care at this time, Dr Celene KrasBrooten at the bedside. Dopamine stopped, Ventilator turned off, pacing stopped.

## 2015-05-04 NOTE — Code Documentation (Signed)
Successful intubation by Dr. Sibyl Parrhapman.

## 2015-05-04 NOTE — ED Notes (Addendum)
Transcutaneous pacing started at this time. Rate of 60

## 2015-05-04 NOTE — Progress Notes (Signed)
Chaplain was paged for a family member who was waiting for a cardiac pt. Chaplain prayed and supported the famikly member as her spouse was transitioning. Dr came in and explained the condition  and gave empathic support to the spouse. Chaplain stayed with family until support came from the church. Chaplain stayed until family left when chaplain walked family to the car.   March 09, 2015 1900  Clinical Encounter Type  Visited With Patient and family together  Visit Type Spiritual support  Referral From Care management  Spiritual Encounters  Spiritual Needs Prayer;Emotional  Stress Factors  Family Stress Factors Loss

## 2015-05-04 NOTE — ED Provider Notes (Addendum)
CSN: [id number]     Arrival date & time May 25, 2015  1706 History   First MD Initiated Contact with Patient 2015/05/25 1744     Chief Complaint  Patient presents with  . Cardiac Arrest     (Consider location/radiation/quality/duration/timing/severity/associated sxs/prior Treatment) HPI Comments: Pt comes in with cardiac arrest. LEVEL 5 CAVEAT - UNRESPONSIVE Per EMS, pt has hx of HTN. He has been feeling unwell today. PT was last seen by his spouse about an hour ago. She then founf him unresponsive and with poor respirations, so EMS was called. EMS arrived and noted that pt was having agonal respiration and poor pulses. Pt went into asystole and had epi x 3 total prior to ER arrival with chest compression.  He arrives with a poor pulse and bradycardia, and GCS - 3. He has agonal respirations. CBG> 300. Pt is cool to touch, but not cold and he appears to be slightly contracted, pupils are 3 and not reactive. EMS reports that they were informed that pt is full code.  The history is provided by the EMS personnel.    Past Medical History  Diagnosis Date  . Hypertension   . Urinary retention    Past Surgical History  Procedure Laterality Date  . Mouth surgery    . Hip arthroplasty  12/22/2011    Procedure: ARTHROPLASTY BIPOLAR HIP;  Surgeon: Verlee Rossetti, MD;  Location: [facility];  Service: Orthopedics;  Laterality: Left;  Left hip hemiarthoplasty - Zerita Boers Triloc    Family History  Problem Relation Age of Onset  . Heart disease Mother    Social History  Substance Use Topics  . Smoking status: Former Smoker    Quit date: 12/21/1992  . Smokeless tobacco: Never Used  . Alcohol Use: No    Review of Systems  Unable to perform ROS: Patient unresponsive      Allergies  Review of patient's allergies indicates no known allergies.  Home Medications   Prior to Admission medications   Medication Sig Start Date End Date Taking? Authorizing Provider  buPROPion (WELLBUTRIN XL) 300 MG 24 hr  tablet Take 300 mg by mouth daily.   Yes Historical Provider, MD  finasteride (PROSCAR) 5 MG tablet Take 5 mg by mouth daily.     Yes Historical Provider, MD  losartan (COZAAR) 100 MG tablet Take 50 mg by mouth daily.    Yes Historical Provider, MD  ALPRAZolam (XANAX) 0.25 MG tablet Take 0.125 mg by mouth daily as needed. For anxiety    Historical Provider, MD  ARIPiprazole (ABILIFY) 2 MG tablet Take 1 tablet (2 mg total) by mouth 2 (two) times daily. 09/15/11 10/15/11  Simbiso Ranga, MD  ARIPiprazole (ABILIFY) 5 MG tablet Take 2.5 mg by mouth 2 (two) times daily.    Historical Provider, MD  aspirin 81 MG chewable tablet Chew 81 mg by mouth daily.    Historical Provider, MD  enoxaparin (LOVENOX) 30 MG/0.3ML injection Inject 0.3 mLs (30 mg total) into the skin every 12 (twelve) hours. 12/26/11   Nishant Dhungel, MD  feeding supplement (ENSURE CLINICAL STRENGTH) LIQD Take 237 mLs by mouth at bedtime. 09/15/11   Simbiso Ranga, MD  Multiple Vitamin (MULTIVITAMIN WITH MINERALS) TABS Take 1 tablet by mouth daily.    Historical Provider, MD  polyethylene glycol (MIRALAX / GLYCOLAX) packet Take 17 g by mouth daily.    Historical Provider, MD  venlafaxine (EFFEXOR-XR) 75 MG 24 hr capsule Take 225 mg by mouth daily.  Historical Provider, MD   BP 81/45 mmHg  Pulse 60  Resp 28  Wt 138 lb 14.2 oz (63 kg)  SpO2 97% Physical Exam  HENT:  Head: Atraumatic.  Eyes: Conjunctivae are normal.  Cardiovascular:  BRADYCARDIA  Pulmonary/Chest:  AGONAL  Abdominal: Soft.  Neurological:  gcs-3  Nursing note and vitals reviewed.   ED Course  External pacer Date/Time: 05-19-15 5:12 PM Performed by: Derwood Kaplan Authorized by: Derwood Kaplan Consent: The procedure was performed in an emergent situation. Patient identity confirmed: arm band Local anesthesia used: no Patient sedated: no Patient tolerance: Patient tolerated the procedure well with no immediate complications Comments: Set HR to  60   INTUBATION Performed by: Derwood Kaplan  Required items: required blood products, implants, devices, and special equipment available Patient identity confirmed: provided demographic data and hospital-assigned identification number Time out: Immediately prior to procedure a "time out" was called to verify the correct patient, procedure, equipment, support staff and site/side marked as required.  Indications: Airway protection  Intubation method: Direct Laryngoscopy   Preoxygenation: BVM  Sedatives: 20 mg Etomidate Paralytic: 50 mg Rocuronium   Tube Size: 8 cuffed  Post-procedure assessment: chest rise and ETCO2 monitor Breath sounds: equal and absent over the epigastrium Tube secured with: ETT holder Chest x-ray interpreted by radiologist and me.  Chest x-ray findings: endotracheal tube in appropriate position  Patient tolerated the procedure well with no immediate complications.      (including critical care time) Labs Review Labs Reviewed  CBC WITH DIFFERENTIAL/PLATELET - Abnormal; Notable for the following:    WBC 21.1 (*)    RBC 4.20 (*)    Neutro Abs 17.9 (*)    Monocytes Absolute 1.2 (*)    All other components within normal limits  COMPREHENSIVE METABOLIC PANEL - Abnormal; Notable for the following:    Potassium 5.2 (*)    CO2 17 (*)    Glucose, Bld 267 (*)    BUN 42 (*)    Creatinine, Ser 2.14 (*)    Calcium 8.6 (*)    Total Protein 5.0 (*)    Albumin 2.9 (*)    AST 404 (*)    ALT 422 (*)    Total Bilirubin 1.5 (*)    GFR calc non Af Amer 27 (*)    GFR calc Af Amer 32 (*)    Anion gap 17 (*)    All other components within normal limits  MAGNESIUM - Abnormal; Notable for the following:    Magnesium 2.9 (*)    All other components within normal limits  PHOSPHORUS - Abnormal; Notable for the following:    Phosphorus 6.8 (*)    All other components within normal limits  I-STAT CG4 LACTIC ACID, ED - Abnormal; Notable for the following:     Lactic Acid, Venous 8.26 (*)    All other components within normal limits  I-STAT CHEM 8, ED - Abnormal; Notable for the following:    BUN 60 (*)    Creatinine, Ser 1.80 (*)    Glucose, Bld 266 (*)    Calcium, Ion 1.04 (*)    All other components within normal limits  CULTURE, BLOOD (ROUTINE X 2)  CULTURE, BLOOD (ROUTINE X 2)  CK  I-STAT TROPOININ, ED    Imaging Review Dg Chest Portable 1 View  05-19-2015  CLINICAL DATA:  Cardiac arrest, ETT placement EXAM: PORTABLE CHEST 1 VIEW COMPARISON:  12/24/2011 FINDINGS: Endotracheal tube terminates 4 cm above the carina. Chronic interstitial markings/emphysematous changes. Mild  eventration of left hemidiaphragm. No pleural effusion or pneumothorax. Cardiomegaly. Defibrillator pads overlying the left hemithorax. IMPRESSION: Endotracheal tube terminates 4 cm above the carina. No evidence of acute cardiopulmonary disease. Electronically Signed   By: Charline BillsSriyesh  Krishnan M.D.   On: 07/20/2014 17:43   I have personally reviewed and evaluated these images and lab results as part of my medical decision-making.   EKG Interpretation None     ED ECG REPORT   Date: 07/20/2014  Rate: 30  Rhythm: indeterminate  QRS Axis: left  Intervals: prolonged Qtc, wide qrs  ST/T Wave abnormalities: nonspecific ST/T changes  Conduction Disutrbances:nonspecific intraventricular conduction delay  Narrative Interpretation:   Old EKG Reviewed: changes noted - bradycardia, likely complete AV blockage or junctional escape rhythm  I have personally reviewed the EKG tracing and agree with the computerized printout as noted.   MDM   Final diagnoses:  Complete heart block (HCC)  Cardiac arrest (HCC)    Pt comes in with cc of unresponsive. After arrival to the ER, pt noted to be bradycardic, unresponsive. We started transcutaneous pacing. We intubated the patient.  Goals of care discussed with the wife who arrived few minutes later. She informs us that pt was  DNR, and it appears to us that everything happened so abruptly that she didn't get a chance to fully synthesize the details. She agreed not to do anything invasive. She is aware of the poor prognosis. She is made aware that the transcutaneous pacing is aiding with HR, and it is unlikely that he will survive if the pacing is taken off. We took the pacing off after discussing with her, and pt passed away at 6:40 pm.  [facility]  CRITICAL CARE Performed by: Derwood KaplanNanavati, Stepheni Cameron   Total critical care time: 40 minutes - got unresponsive, bradycardic patient in shock - initial stabilization  Critical care time was exclusive of separately billable procedures and treating other patients.  Critical care was necessary to treat or prevent imminent or life-threatening deterioration.  Critical care was time spent personally by me on the following activities: development of treatment plan with patient and/or surrogate as well as nursing, discussions with consultants, evaluation of patient's response to treatment, examination of patient, obtaining history from patient or surrogate, ordering and performing treatments and interventions, ordering and review of laboratory studies, ordering and review of radiographic studies, pulse oximetry and re-evaluation of patient's condition.   Derwood KaplanAnkit Zayd Bonet, MD 01/17/15 1914  Derwood KaplanAnkit Cortavious Nix, MD 01/17/15 [phone]

## 2015-05-04 NOTE — Progress Notes (Signed)
Wife wishes to withdraw care on patient. RT disconnected patient from ventilator per MD. Patient is currently still intubated. RT waiting for MD to give orders to extubate.

## 2015-05-04 NOTE — ED Provider Notes (Signed)
Following initial stabilization efforts patient appeared to have a very poor prognosis given intermittent capture with cutaneous pacing. Patient also in complete heart block with history consistent with suspected STEMI prior to arrest. Patient has Parkinson's disease and limited by dementia with significant debility at baseline. Discussed patient's resuscitation course and likely poor prognosis with his wife. Apparently patient has a portable DO NOT RESUSCITATE order, however wife was unable to obtain this document when EMS was contacted and therefore CPR was initiated. After discussion patient's wife, she confirmed DO NOT RESUSCITATE status and following further discussion and indications that patient was deteriorating despite current efforts she felt as though additional escalation of resuscitation would be against his wishes.  Initially, transvenous pacing was considered, however patient was deteriorating in preparation for procedure.  Wife was brought into the resuscitation bay and upon coming to bedside and seeing patient, she decided to withdraw current resuscitation efforts in accord with patient's previously expressed wishes.  Chaplain present for supportive care, and condolences condolences offered to family upon cessation of cardiac activity and declaration of death.  Patient care was discussed with my attending, Dr. Rhunette CroftNanavati.   Gavin PoundJustin Stephaney Steven, MD 04/27/15 [id number]  Gavin PoundJustin Fareedah Mahler, MD 04/27/15 [id number]  Derwood KaplanAnkit Nanavati, MD 05/14/15 [id number]

## 2015-05-04 NOTE — Progress Notes (Signed)
Patient was intubated by ED resident with RT assistance. Attending MD was at the bedside along with RN. Patient has a 7.5 tube placed 22@ lip. RT will continue to monitor.

## 2015-05-04 NOTE — Code Documentation (Signed)
Patient time of death occurred at [id number].

## 2015-05-04 NOTE — ED Notes (Signed)
Per EMS, pt was found by wife not breathing after seeing him  1 hour earlier. Upon EMS arrival pt had agonal respirations. Pt lost pulses shortly after EMS arrival. EMS started CPR and received 2 EPIs and had ROSC. Pt then began becoming bradycardic and lost pulses again going asystole. EMS started CPR and gave 1 of epi and regained pulses. EMS unable to secure the airway. Pt being assisted with BVM upon arrival.

## 2015-05-04 DEATH — deceased

## 2015-11-04 IMAGING — CR DG CHEST 1V PORT
2 series · 2 of 2 positions shown · non-contrast
Comparison: 12/24/2011

CLINICAL DATA: Cardiac arrest, ETT placement

EXAM:
PORTABLE CHEST 1 VIEW

[AP (1 of 2)]
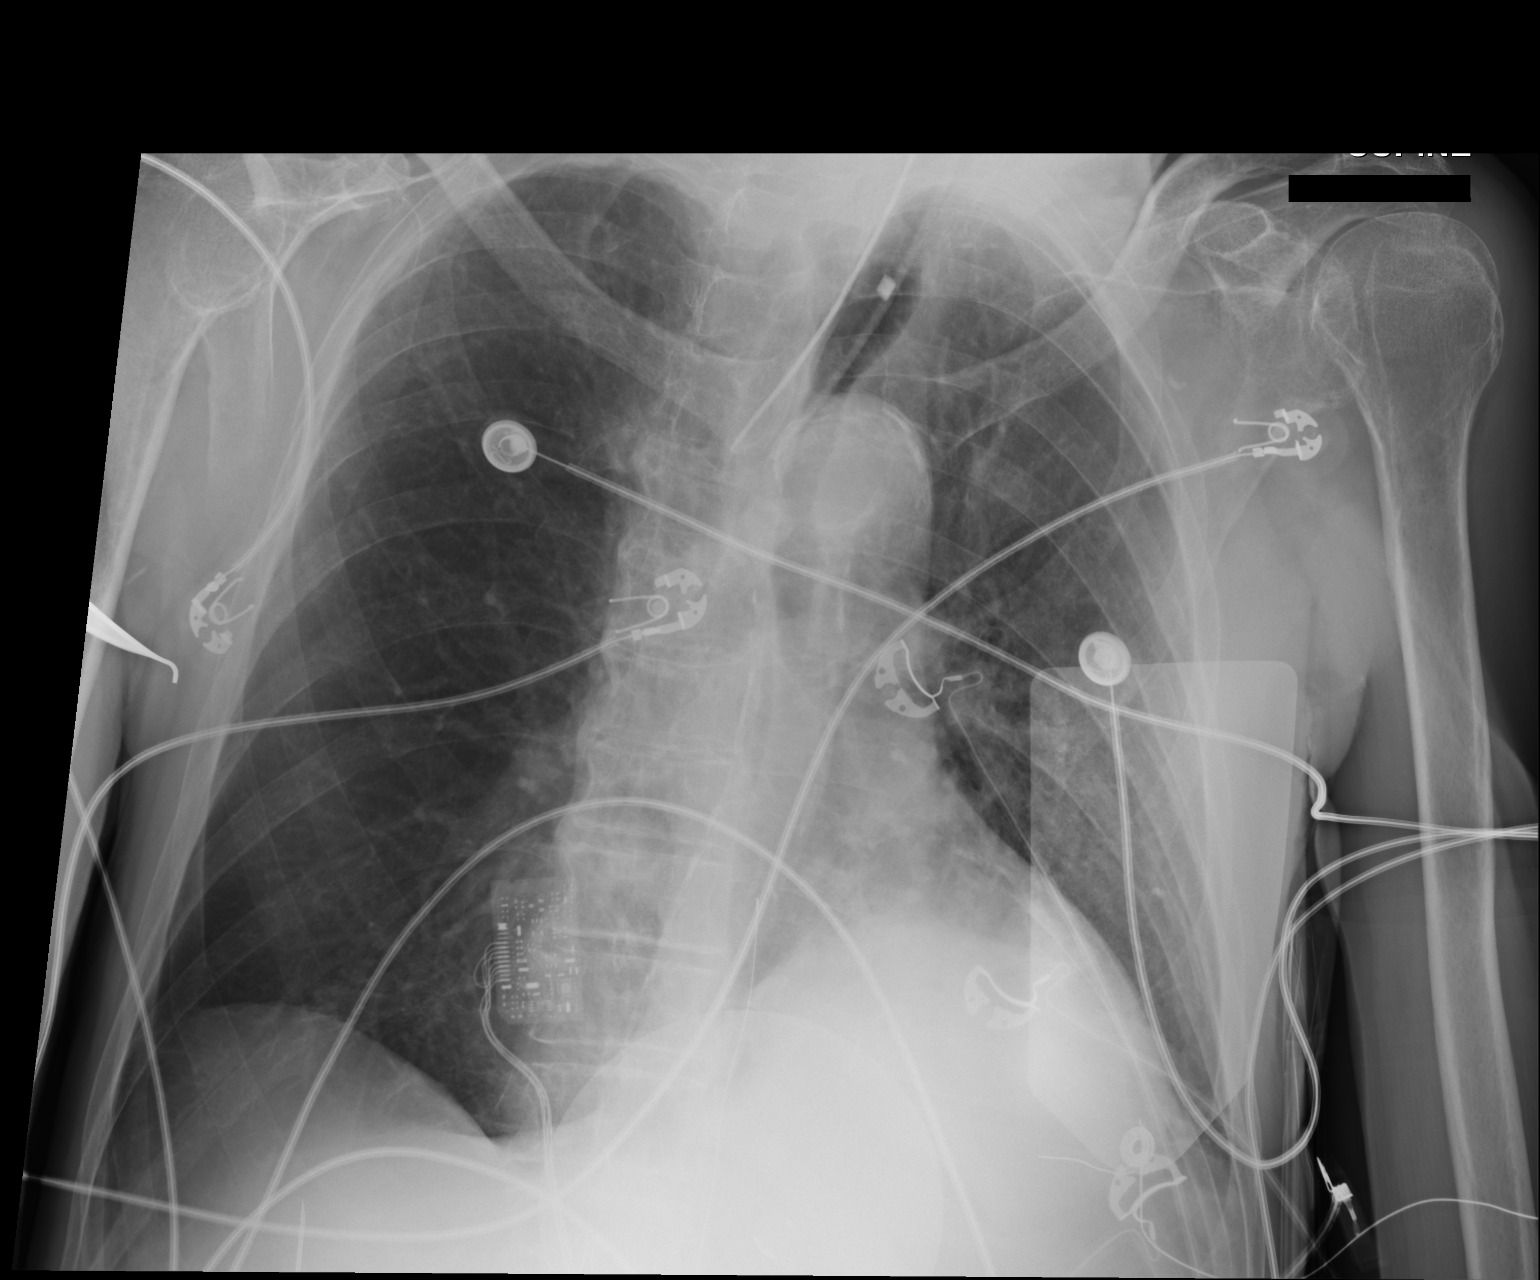

[AP (2 of 2)]
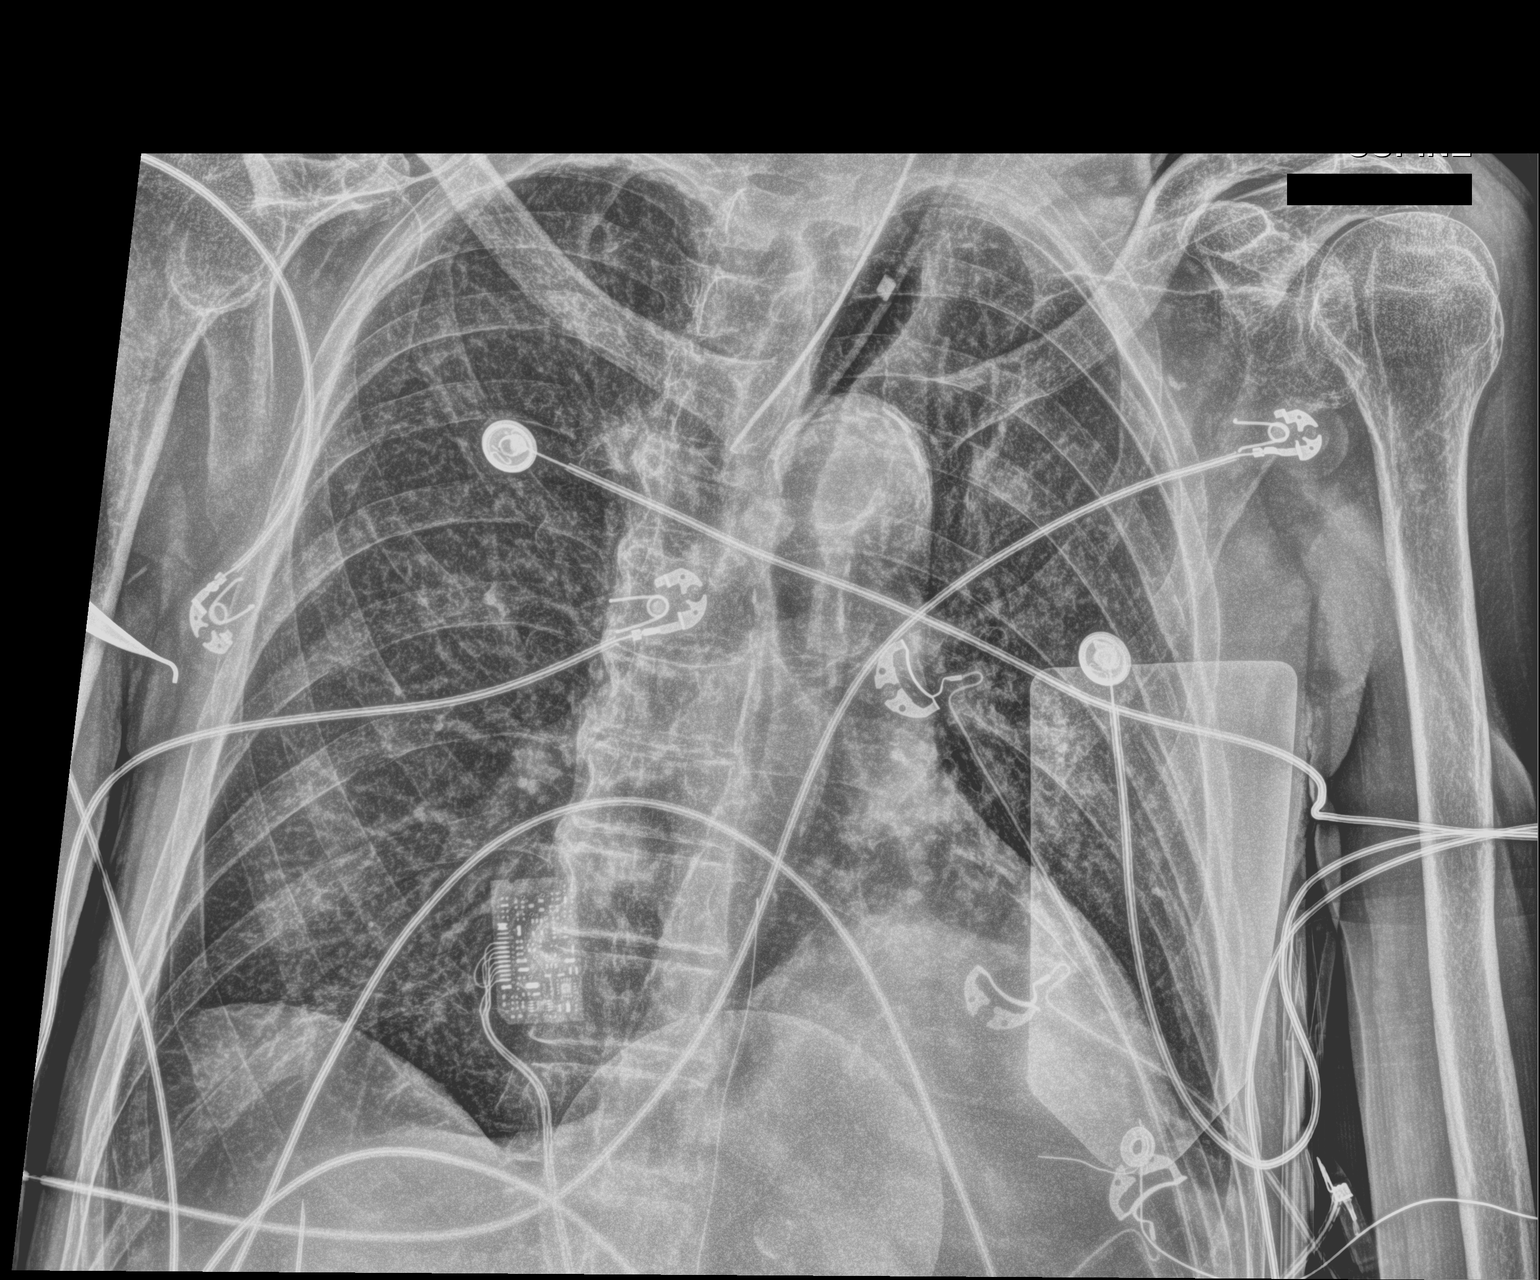

[2 of 2 positions shown; findings below may reference images not displayed]

FINDINGS: Endotracheal tube terminates 4 cm above the carina.

Chronic interstitial markings/emphysematous changes. Mild
eventration of left hemidiaphragm. No pleural effusion or
pneumothorax.

Cardiomegaly.

Defibrillator pads overlying the left hemithorax.
IMPRESSION: Endotracheal tube terminates 4 cm above the carina.

No evidence of acute cardiopulmonary disease.
